# Patient Record
Sex: Female | Born: 1975 | Race: White | Hispanic: Yes | Marital: Married | Smoking: Never smoker
Health system: Southern US, Community
[De-identification: ages and names within clinical notes are randomized; demographics above are authoritative.]

## PROBLEM LIST (undated history)

## (undated) ENCOUNTER — Inpatient Hospital Stay (HOSPITAL_COMMUNITY): Payer: Self-pay

## (undated) DIAGNOSIS — E119 Type 2 diabetes mellitus without complications: Secondary | ICD-10-CM

## (undated) DIAGNOSIS — I10 Essential (primary) hypertension: Secondary | ICD-10-CM

## (undated) HISTORY — PX: PELVIC LAPAROSCOPY: SHX162

## (undated) HISTORY — DX: Type 2 diabetes mellitus without complications: E11.9

---

## 2003-08-05 HISTORY — PX: CHOLECYSTECTOMY: SHX55

## 2007-04-02 ENCOUNTER — Ambulatory Visit (HOSPITAL_COMMUNITY): Admission: RE | Admit: 2007-04-02 | Discharge: 2007-04-02 | Payer: Self-pay | Admitting: Obstetrics and Gynecology

## 2007-05-06 ENCOUNTER — Ambulatory Visit: Payer: Self-pay | Admitting: Obstetrics & Gynecology

## 2007-05-20 ENCOUNTER — Ambulatory Visit: Payer: Self-pay | Admitting: Obstetrics & Gynecology

## 2007-06-03 ENCOUNTER — Ambulatory Visit: Payer: Self-pay | Admitting: Obstetrics & Gynecology

## 2007-06-03 ENCOUNTER — Ambulatory Visit (HOSPITAL_COMMUNITY): Admission: RE | Admit: 2007-06-03 | Discharge: 2007-06-03 | Payer: Self-pay | Admitting: Obstetrics & Gynecology

## 2007-06-17 ENCOUNTER — Ambulatory Visit: Payer: Self-pay | Admitting: Obstetrics & Gynecology

## 2007-06-21 ENCOUNTER — Ambulatory Visit: Payer: Self-pay | Admitting: Obstetrics & Gynecology

## 2007-06-21 ENCOUNTER — Encounter: Admission: RE | Admit: 2007-06-21 | Discharge: 2007-06-21 | Payer: Self-pay | Admitting: Obstetrics & Gynecology

## 2007-06-28 ENCOUNTER — Ambulatory Visit: Payer: Self-pay | Admitting: Obstetrics & Gynecology

## 2007-07-05 ENCOUNTER — Ambulatory Visit: Payer: Self-pay | Admitting: Obstetrics & Gynecology

## 2007-07-08 ENCOUNTER — Ambulatory Visit: Payer: Self-pay | Admitting: Obstetrics and Gynecology

## 2007-07-12 ENCOUNTER — Ambulatory Visit: Payer: Self-pay | Admitting: Obstetrics & Gynecology

## 2007-07-15 ENCOUNTER — Ambulatory Visit: Payer: Self-pay | Admitting: Obstetrics & Gynecology

## 2007-07-19 ENCOUNTER — Ambulatory Visit: Payer: Self-pay | Admitting: Obstetrics & Gynecology

## 2007-07-22 ENCOUNTER — Ambulatory Visit: Payer: Self-pay | Admitting: Obstetrics & Gynecology

## 2007-07-26 ENCOUNTER — Ambulatory Visit: Payer: Self-pay | Admitting: Obstetrics & Gynecology

## 2007-07-28 ENCOUNTER — Inpatient Hospital Stay (HOSPITAL_COMMUNITY): Admission: RE | Admit: 2007-07-28 | Discharge: 2007-07-28 | Payer: Self-pay | Admitting: Obstetrics & Gynecology

## 2007-07-28 ENCOUNTER — Ambulatory Visit: Payer: Self-pay | Admitting: Gynecology

## 2007-08-02 ENCOUNTER — Ambulatory Visit (HOSPITAL_COMMUNITY): Admission: RE | Admit: 2007-08-02 | Discharge: 2007-08-02 | Payer: Self-pay | Admitting: Obstetrics & Gynecology

## 2007-08-02 ENCOUNTER — Ambulatory Visit: Payer: Self-pay | Admitting: Obstetrics & Gynecology

## 2007-08-06 ENCOUNTER — Ambulatory Visit: Payer: Self-pay | Admitting: Obstetrics and Gynecology

## 2007-08-09 ENCOUNTER — Ambulatory Visit: Payer: Self-pay | Admitting: Obstetrics & Gynecology

## 2007-08-10 ENCOUNTER — Ambulatory Visit: Payer: Self-pay | Admitting: Family Medicine

## 2007-08-10 ENCOUNTER — Inpatient Hospital Stay (HOSPITAL_COMMUNITY): Admission: RE | Admit: 2007-08-10 | Discharge: 2007-08-12 | Payer: Self-pay | Admitting: Family Medicine

## 2007-08-19 ENCOUNTER — Inpatient Hospital Stay (HOSPITAL_COMMUNITY): Admission: AD | Admit: 2007-08-19 | Discharge: 2007-08-19 | Payer: Self-pay | Admitting: Obstetrics & Gynecology

## 2010-04-26 ENCOUNTER — Ambulatory Visit (HOSPITAL_COMMUNITY): Admission: RE | Admit: 2010-04-26 | Discharge: 2010-04-26 | Payer: Self-pay | Admitting: Obstetrics

## 2010-04-30 ENCOUNTER — Ambulatory Visit (HOSPITAL_COMMUNITY): Admission: RE | Admit: 2010-04-30 | Discharge: 2010-04-30 | Payer: Self-pay | Admitting: Obstetrics

## 2010-05-02 ENCOUNTER — Ambulatory Visit (HOSPITAL_COMMUNITY): Admission: RE | Admit: 2010-05-02 | Discharge: 2010-05-02 | Payer: Self-pay | Admitting: Obstetrics

## 2010-05-06 ENCOUNTER — Ambulatory Visit (HOSPITAL_COMMUNITY): Admission: RE | Admit: 2010-05-06 | Discharge: 2010-05-06 | Payer: Self-pay | Admitting: Obstetrics

## 2010-05-08 ENCOUNTER — Inpatient Hospital Stay (HOSPITAL_COMMUNITY): Admission: RE | Admit: 2010-05-08 | Discharge: 2010-05-11 | Payer: Self-pay | Admitting: Obstetrics

## 2010-05-08 ENCOUNTER — Encounter (INDEPENDENT_AMBULATORY_CARE_PROVIDER_SITE_OTHER): Payer: Self-pay | Admitting: Obstetrics

## 2010-08-24 ENCOUNTER — Encounter: Payer: Self-pay | Admitting: Obstetrics & Gynecology

## 2010-08-25 ENCOUNTER — Encounter: Payer: Self-pay | Admitting: Obstetrics

## 2010-10-17 LAB — CBC
HCT: 32.9 % — ABNORMAL LOW (ref 36.0–46.0)
HCT: 40.3 % (ref 36.0–46.0)
Hemoglobin: 14.2 g/dL (ref 12.0–15.0)
MCV: 93.2 fL (ref 78.0–100.0)
MCV: 94.1 fL (ref 78.0–100.0)
RBC: 3.5 MIL/uL — ABNORMAL LOW (ref 3.87–5.11)
RBC: 4.32 MIL/uL (ref 3.87–5.11)
WBC: 5.7 10*3/uL (ref 4.0–10.5)
WBC: 6.5 10*3/uL (ref 4.0–10.5)

## 2010-12-17 NOTE — Discharge Summary (Signed)
[name], [name] NO.:  [id number]   MEDICAL RECORD NO.:  [id number]          PATIENT TYPE:  INP   LOCATION:                                [facility]  [facility]   PHYSICIAN:  Tanya S. Shawnie Pons, M.D.   DATE OF BIRTH:  08/16/1975   DATE OF ADMISSION:  08/10/2007  DATE OF DISCHARGE:  08/12/2007                               DISCHARGE SUMMARY   REASON FOR ADMISSION:  Repeat low transverse cesarean section.   HOSPITAL COURSE:  This is a 35 year old G2, P2-0-0-2 at 40 and 5/7 weeks  who was found to have oligohydramnios in clinic and thus was admitted  for repeat low transverse C-section.  She has a history of chronic  hypertension and gestational diabetes, type A1.  She had her prenatal  care at [facility].  She delivered a viable female via  repeat low transverse cesarean section on August 10, 2007.  The female's  weight was 7 pounds 9 ounces and Apgar's were 9 at 1 minute and 9 at 5  minutes.  EBL was 1000 mL.  No procedures were required postpartum and  patient did well.  Mom plans to breast feed and a lactation consultant  has been by to see her.  She does not want a circumcision for her baby  at this point.  She plans to use Micronor for birth control.  Maternal  labs include a blood type O+ and she is Rubella immune.   CONDITION DISCHARGE:  Stable.   INSTRUCTIONS:  Activity, no heavy lifting for 6 weeks.  Routine diet.  She is to follow up in 6 weeks with the [facility]  Department.  She will have her staples removed on postop day #7 by the  Baby Love Nurse at her home.   MEDICATIONS:  1. Motrin 600 mg p.o. every 6 hours p.r.n. pain.  2. Percocet 5/325 one to 2 tabs every 4 to 6 hours p.r.n. pain.  3. Colace 100 mg p.o. b.i.d. p.r.n. constipation.  4. Prenatal vitamins 1 p.o. daily.  5. Micronor 1 p.o. daily.      Erin Boozer, MD      Erin [name]. Shawnie Pons, M.D.  Electronically Signed    SA/MEDQ  D:  08/12/2007  T:  08/12/2007   Job:  [id number]

## 2010-12-17 NOTE — Op Note (Signed)
NAMEJUSTISE, Erin [name] NO.:  [id number]   MEDICAL RECORD NO.:  [id number]           PATIENT TYPE:   LOCATION:                                [facility]   PHYSICIAN:  Tanya S. Shawnie Pons, M.D.   DATE OF BIRTH:  [birth year]   DATE OF PROCEDURE:  08/10/2007  DATE OF DISCHARGE:                               OPERATIVE REPORT   PREOPERATIVE DIAGNOSES:  1. Intrauterine gestation at 38 weeks 5 days.  2. History of previous cesarean section.  3. Uncomplicated prenatal course.   POSTOPERATIVE DIAGNOSES:  1. Intrauterine gestation at 38 weeks 5 days.  2. History of previous cesarean section.  3. Uncomplicated prenatal course.   PROCEDURE:  Repeat low transverse cesarean section.   SURGEON:  Shelbie Proctor. Shawnie Pons, M.D.   ASSISTANT:  Karlton Lemon, MD.   ANESTHESIA:  Epidural.   FINDINGS:  1. Viable infant female weighing 7 pounds 9 ounces with Apgars 9 at one      minute, 9 at five minutes, in cephalic presentation.  2. Clear amniotic fluid.  3. Normal female pelvic anatomy with decidual changes of pregnancy.   ESTIMATED BLOOD LOSS:  1000 mL   DRAINS:  Foley with clear yellow urine.   COMPLICATIONS:  None immediate.   SPECIMENS:  Placenta to labor and delivery; cord blood to the lab.   INDICATIONS FOR PROCEDURE:  This is a 35 year old gravida 2, para 1-0-0-  1, presenting for desired repeat low transverse cesarean section at 38  weeks and 5 days.   DESCRIPTION OF PROCEDURE:  The patient was taken to the operating room  and after obtaining adequate epidural anesthesia was prepped and draped  in the usual sterile manner in the supine position with left lateral  uterine displacement.  After ensuring adequate anesthesia a Pfannenstiel  skin incision was made using the scalpel.  The incision was carried down  through the subcutaneous tissues using the scalpel.  The rectus fascia  was nicked in the midline.  The incision was extended laterally in each  direction using the  Mayo scissors.  The rectus muscles were dissected  free of the fascia using both sharp and blunt dissection.  The rectus  muscles were separated bluntly.  The parietal peritoneum was identified  and entered bluntly under direct visualization.  At this time an Alexis  wound retractor was placed.  A low transverse uterine incision was made  using the scalpel.  The incision was extended laterally and superior  using blunt dissection.  A hand was placed within the uterine cavity and  used to elevate and flex the head of the infant, which was delivered  without difficulty.  The infant was bulb suctioned after delivery of the  head.  The body of the infant was then delivered without difficulty.  The cord was doubly clamped and cut and the infant handed to the nursery  team in attendance.  A specimen was collected for cord blood.  The  placenta was delivered manually and with cord traction, appeared intact.  The endometrial cavity was wiped of any traces of membranes using wet  laparotomy sponges.  The edges of the uterine  incision were grasped with  ring clamps and the uterine incision was closed in one layer of running  locking 0 Vicryl.  One small area of bleeding was controlled using a  single figure-of-eight stitch of 0 Vicryl.  The uterine incision was  inspected and found to have adequate hemostasis.  The rectus fascia was  reapproximated with 1 suture of 0 Vicryl in a running unlocked fashion.  Small areas of bleeding in the subcutaneous tissue were controlled using  Bovie cautery.  The skin was reapproximated with stainless steel skin  staples.  Sponge, needle, and instrument counts were correct x2.  The  patient tolerated the procedure well and went to the post anesthesia  care unit in stable condition.      Karlton Lemon, MD  Electronically Signed     ______________________________  Shelbie Proctor. Shawnie Pons, M.D.    NS/MEDQ  D:  08/11/2007  T:  08/11/2007  Job:  [id number]

## 2010-12-17 NOTE — Op Note (Signed)
NAMEMarland Kitchen  MALAIYA, PACZKOWSKI NO.:  [id number]   MEDICAL RECORD NO.:  [id number]          PATIENT TYPE:  WOC   LOCATION:  WOC                          [facility]   PHYSICIAN:  Tanya S. Shawnie Pons, M.D.   DATE OF BIRTH:  November 08, 1975   DATE OF PROCEDURE:  08/10/2007  DATE OF DISCHARGE:                               OPERATIVE REPORT   PREOPERATIVE DIAGNOSES:  1. Intrauterine pregnancy at 38 weeks and five days.  2. History of previous cesarean section.  3. Uncomplicated prenatal course.   POSTOPERATIVE DIAGNOSES:  1. Intrauterine pregnancy at 38 weeks and five days.  2. History of previous cesarean section.  3. Uncomplicated prenatal course.   PROCEDURE:  (CANCEL THIS DICTATION).      Karlton Lemon, MD      ______________________________  Shelbie Proctor. Shawnie Pons, M.D.    NS/MEDQ  D:  08/11/2007  T:  08/11/2007  Job:  [id number]

## 2011-04-24 LAB — URINALYSIS, ROUTINE W REFLEX MICROSCOPIC
Glucose, UA: NEGATIVE
Specific Gravity, Urine: 1.01
Urobilinogen, UA: 0.2

## 2011-04-24 LAB — ABO/RH: ABO/RH(D): O POS

## 2011-04-24 LAB — POCT URINALYSIS DIP (DEVICE)
Bilirubin Urine: NEGATIVE
Glucose, UA: NEGATIVE
Hgb urine dipstick: NEGATIVE
Ketones, ur: NEGATIVE
Nitrite: NEGATIVE
Specific Gravity, Urine: 1.02

## 2011-04-24 LAB — CBC
HCT: 33.6 — ABNORMAL LOW
HCT: 40.5
HCT: 43.3
Hemoglobin: 14.4
Hemoglobin: 15.4 — ABNORMAL HIGH
MCHC: 35.7
MCV: 92.3
Platelets: 159
RBC: 4.39
RDW: 12.3
WBC: 6.2

## 2011-04-24 LAB — COMPREHENSIVE METABOLIC PANEL
Albumin: 3.3 — ABNORMAL LOW
BUN: 18
Creatinine, Ser: 0.6
Glucose, Bld: 103 — ABNORMAL HIGH
Total Bilirubin: 0.6
Total Protein: 7.3

## 2011-04-24 LAB — URINE MICROSCOPIC-ADD ON

## 2011-04-24 LAB — TYPE AND SCREEN: ABO/RH(D): O POS

## 2011-05-09 LAB — COMPREHENSIVE METABOLIC PANEL
AST: 15
Albumin: 2.7 — ABNORMAL LOW
BUN: 7
Calcium: 8.5
Chloride: 105
Creatinine, Ser: 0.48
GFR calc Af Amer: >60
Total Bilirubin: 0.5

## 2011-05-09 LAB — POCT URINALYSIS DIP (DEVICE)
Bilirubin Urine: NEGATIVE
Glucose, UA: NEGATIVE
Hgb urine dipstick: NEGATIVE
Hgb urine dipstick: NEGATIVE
Ketones, ur: 15 — AB
Ketones, ur: 40 — AB
Nitrite: NEGATIVE
Operator id: 120861
Operator id: 194561
Operator id: 297281
Protein, ur: NEGATIVE
Protein, ur: NEGATIVE
Specific Gravity, Urine: 1.015
Specific Gravity, Urine: 1.015
Specific Gravity, Urine: 1.02
Urobilinogen, UA: 0.2
Urobilinogen, UA: 0.2
Urobilinogen, UA: 0.2
pH: 6
pH: 6

## 2011-05-09 LAB — CBC
HCT: 40
MCHC: 35.7
MCV: 93
Platelets: 197
RDW: 13
WBC: 6.1

## 2011-05-12 LAB — POCT URINALYSIS DIP (DEVICE)
Bilirubin Urine: NEGATIVE
Glucose, UA: NEGATIVE
Glucose, UA: NEGATIVE
Nitrite: NEGATIVE
Nitrite: NEGATIVE
Operator id: 194561
Operator id: 194561
Specific Gravity, Urine: 1.02
Urobilinogen, UA: 1
Urobilinogen, UA: 1

## 2011-05-13 LAB — POCT URINALYSIS DIP (DEVICE)
Bilirubin Urine: NEGATIVE
Glucose, UA: NEGATIVE
Glucose, UA: NEGATIVE
Nitrite: NEGATIVE
Nitrite: NEGATIVE
Operator id: 194561
Operator id: 159681
Urobilinogen, UA: 1
Urobilinogen, UA: 0.2

## 2011-05-14 LAB — POCT URINALYSIS DIP (DEVICE)
Glucose, UA: NEGATIVE
Glucose, UA: NEGATIVE
Hgb urine dipstick: NEGATIVE
Hgb urine dipstick: NEGATIVE
Nitrite: NEGATIVE
Nitrite: NEGATIVE
Operator id: 148111
Urobilinogen, UA: 0.2
Urobilinogen, UA: 0.2

## 2011-05-15 LAB — POCT URINALYSIS DIP (DEVICE)
Glucose, UA: NEGATIVE
Hgb urine dipstick: NEGATIVE
Nitrite: NEGATIVE
Urobilinogen, UA: 0.2
pH: 6

## 2016-01-23 ENCOUNTER — Ambulatory Visit: Payer: Self-pay

## 2016-01-24 ENCOUNTER — Encounter: Payer: Self-pay | Admitting: Pediatric Intensive Care

## 2016-01-24 ENCOUNTER — Ambulatory Visit: Payer: Self-pay | Attending: Internal Medicine

## 2016-01-24 DIAGNOSIS — I10 Essential (primary) hypertension: Secondary | ICD-10-CM

## 2016-01-24 NOTE — Congregational Nurse Program (Signed)
Congregational Nurse Program Note  Date of Encounter: 01/24/2016  Past Medical History: No past medical history on file.  Encounter Details:     CNP Questionnaire - 01/24/16 1528    Patient Demographics   Is this a new or existing patient? New   Patient is considered a/an Immigrant   Race Latino/Hispanic   Patient Assistance   Location of Patient Assistance [employer]   Patient's financial/insurance status [employer] officer, [employer]/Care Connects   Uninsured Patient Yes   Interventions Not Applicable   Patient referred to apply for the following financial assistance [employer] Renewal   Food insecurities addressed Not Applicable   Transportation assistance No   Assistance securing medications No   Educational health offerings Hypertension   Encounter Details   Primary purpose of visit Education/Health Concerns   Was an Emergency Department visit averted? Not Applicable   Does patient have a medical provider? Yes   Patient referred to Not Applicable   Was a mental health screening completed? (GAINS tool) No   Does patient have dental issues? No   Does patient have vision issues? No   Does your patient have an abnormal blood pressure today? No   Since previous encounter, have you referred patient for abnormal blood pressure that resulted in a new diagnosis or medication change? No   Does your patient have an abnormal blood glucose today? No   Since previous encounter, have you referred patient for abnormal blood glucose that resulted in a new diagnosis or medication change? No   Was there a life-saving intervention made? No    Client presented for BP check. States (via interpreter Angie) that she has been taking lisinopril 10 mg daily for four years and is seen yearly by a medical provider for monitoring. States that she did not take her lisinopril yet today but will when she gets home. Encouraged to follow up at [facility] with CN for BP checks. BP was normal today.

## 2016-10-06 ENCOUNTER — Ambulatory Visit: Payer: Self-pay | Admitting: Gynecology

## 2016-10-09 ENCOUNTER — Encounter: Payer: Self-pay | Admitting: Gynecology

## 2016-10-09 ENCOUNTER — Ambulatory Visit (INDEPENDENT_AMBULATORY_CARE_PROVIDER_SITE_OTHER): Payer: Self-pay | Admitting: Gynecology

## 2016-10-09 VITALS — BP 118/72 | Ht 60.0 in | Wt 135.0 lb

## 2016-10-09 DIAGNOSIS — Z30432 Encounter for removal of intrauterine contraceptive device: Secondary | ICD-10-CM

## 2016-10-09 DIAGNOSIS — Z3009 Encounter for other general counseling and advice on contraception: Secondary | ICD-10-CM

## 2016-10-09 NOTE — Progress Notes (Signed)
   Patient is a 41 year old gravida 3 para 3 (3 C-sections) she is using for contraception more than cycle control. Patient had all her blood work and Pap smear recently at the health department has informing that all studies were normal and her flu vaccine is up-to-date.  Exam: Pelvic: Bartholin urethra Skene was within normal limits Vagina: No lesions or discharge Cervix: IUD string was visualized the area was cleansed with Betadine solution and with the use of a ring forcep the IUD string was grasped retrieved shown to the patient and discarded. Bimanual exam not done Rectal exam: Not done  Assessment/plan: As per patient's request Mirena IUD was removed today. We discussed all forms of contraception to include maneuvering the oral contraceptive pill as well as the Nexplanon patient interested in and replacing the Mirena IUD sometime in the summer. Literature information was provided.

## 2016-10-09 NOTE — Patient Instructions (Signed)
Levonorgestrel intrauterine device (IUD) Qu es este medicamento? El DIU CON LEVONORGESTREL es un dispositivo anticonceptivo (control de la natalidad). Un profesional de la salud coloca el dispositivo dentro el tero. Se usa para evitar los embarazos. Este dispositivo tambin puede usarse para tratar el sangrado intenso que se produce durante el perodo menstrual. Este medicamento puede ser utilizado para otros usos; si tiene alguna pregunta consulte con su proveedor de atencin mdica o con su farmacutico. MARCAS COMUNES: Kyleena, LILETTA, Mirena, Skyla Qu le debo informar a mi profesional de la salud antes de tomar este medicamento? Necesitan saber si usted presenta alguno de los siguientes problemas o situaciones: examen de Papanicolaou anormal cncer de mama, tero o crvix diabetes endometritis infeccin genital o plvica actual o en el pasado tener ms de una pareja sexual o si su pareja tiene ms de una pareja enfermedad cardiaca antecedentes de embarazo ectpico o tubrico problemas del sistema inmunolgico DIU colocado en su lugar enfermedad o tumor heptico problemas con cogulos sanguneos o tomar anticoagulantes convulsiones uso de drogas intravenosas tero con forma inusual sangrado vaginal que no ha sido explicado una reaccin alrgica o inusual al levonorgestrel, a otras hormonas, a la silicona, o al polietileno, a otros medicamentos, alimentos, colorantes o conservantes si est embarazada o buscando quedar embarazada si est amamantando a un beb Cmo debo utilizar este medicamento? Un profesional de la salud coloca este dispositivo en el tero. Hable con su pediatra para informarse acerca del uso de este medicamento en nios. Puede requerir atencin especial. Sobredosis: Pngase en contacto inmediatamente con un centro toxicolgico o una sala de urgencia si usted cree que haya tomado demasiado medicamento. ATENCIN: Este medicamento es solo para usted. No comparta este medicamento  con nadie. Qu sucede si me olvido de una dosis? No se aplica en este caso. Segn la marca del dispositivo que tenga insertado, el dispositivo deber reemplazarse cada 3 a 5 aos si desea continuar usando este tipo de mtodo anticonceptivo. Qu puede interactuar con este medicamento? No tome este medicamento con ninguno de los siguientes frmacos: amprenavir bosentano fosamprenavir Este medicamento tambin puede interactuar con los siguientes medicamentos: aprepitant armodafinilo barbitricos para inducir el sueo o para el tratamiento de convulsiones bexaroteno boceprevir griseofulvina medicamentos para tratar convulsiones, tales como carbamazepina, etotona, felbamato, oxcarbazepina, fenitona, topiramato modafinilo pioglitazona rifabutina rifampicina rifapentina algunos medicamentos para tratar la infeccin por el VIH, tales como atazanavir, efavirenz, indinavir, lopinavir, nelfinavir, tipranavir, ritonavir hierba de [place] warfarina Puede ser que esta lista no menciona todas las posibles interacciones. Informe a su profesional de la salud de todos los productos a base de hierbas, medicamentos de venta libre o suplementos nutritivos que est tomando. Si usted fuma, consume bebidas alcohlicas o si utiliza drogas ilegales, indqueselo tambin a su profesional de la salud. Algunas sustancias pueden interactuar con su medicamento. A qu debo estar atento al usar este medicamento? Visite a su mdico o a su profesional de la salud para revisar su evolucin peridicamente. Consulte a su mdico si usted o su pareja tiene contacto sexual con otras personas, descubre que tiene VIH positivo, o contrae una enfermedad de transmisin sexual. Este producto no la protege de la infeccin por VIH (SIDA) ni de ninguna otra enfermedad de transmisin sexual. Puede verificar la colocacin del DIU usted misma colocando los dedos limpios en la parte superior de la vagina para tocar los hilos. No tire de los hilos. Es  un buen hbito verificar la colocacin del DIU despus de cada perodo menstrual. Llame a su mdico   de inmediato si siente ms del DIU que solo los hilos o si no puede sentir los hilos. El DIU podra salirse solo. Es posible que quede embarazada si el dispositivo se sale. Si nota que se ha salido el DIU use un mtodo anticonceptivo de respaldo, como condones, y llame a su proveedor de atencin mdica. Usar tampones no cambiar la posicin del DIU y puede usarlos sin problema durante sus perodos menstruales. Este DIU puede escanearse en forma segura en una resonancia magntica (MRI) solo bajo condiciones especficas. Antes de realizarse una resonancia magntica, informe a su proveedor de atencin mdica que tiene colocado un DIU, y el tipo de DIU que tiene. Qu efectos secundarios puedo tener al utilizar este medicamento? Efectos secundarios que debe informar a su mdico o a su profesional de la salud tan pronto como sea posible: -reacciones alrgicas como erupcin cutnea, picazn o urticarias, hinchazn de la cara, labios o lengua -fiebre, sntomas gripales -llagas genitales -alta presin sangunea -ausencia de un perodo menstrual durante 6 semanas mientras lo utiliza -dolor, hinchazn o calor en las piernas -dolor o sensibilidad del plvico -dolor de cabeza repentino o severo -signos de embarazo -calambres estomacales -falta de aliento repentina -problemas de coordinacin, del habla, al caminar -sangrado, flujo vaginal inusual -color amarillento de los ojos o la piel Efectos secundarios que, por lo general, no requieren atencin mdica (debe informarlos a su mdico o a su profesional de la salud si persisten o si son molestos): -acn -dolor de pecho -cambios en el deseo sexual o capacidad -cambios de peso -calambres, mareos o sensacin de desmayo mientras se introduce el dispositivo -dolor de cabeza -sangrado menstruales irregulares en los primeros 3 a 6 meses de usar -nuseas Puede  ser que esta lista no menciona todos los posibles efectos secundarios. Comunquese a su mdico por asesoramiento mdico sobre los efectos secundarios. Usted puede informar los efectos secundarios a la FDA por telfono al 1-800-FDA-1088. Dnde debo guardar mi medicina? No se aplica en este caso. ATENCIN: Este folleto es un resumen. Puede ser que no cubra toda la posible informacin. Si usted tiene preguntas acerca de esta medicina, consulte con su mdico, su farmacutico o su profesional de la salud.  2018 Elsevier/Gold Standard (2016-08-21 00:00:00)  

## 2016-12-17 ENCOUNTER — Encounter: Payer: Self-pay | Admitting: Gynecology

## 2017-07-14 ENCOUNTER — Emergency Department (HOSPITAL_COMMUNITY)
Admission: EM | Admit: 2017-07-14 | Discharge: 2017-07-14 | Disposition: A | Discharge status: Home / Self Care | Payer: Self-pay | Attending: Emergency Medicine | Admitting: Emergency Medicine

## 2017-07-14 ENCOUNTER — Other Ambulatory Visit: Payer: Self-pay

## 2017-07-14 ENCOUNTER — Encounter (HOSPITAL_COMMUNITY): Payer: Self-pay

## 2017-07-14 DIAGNOSIS — Z79899 Other long term (current) drug therapy: Secondary | ICD-10-CM | POA: Insufficient documentation

## 2017-07-14 DIAGNOSIS — R3915 Urgency of urination: Secondary | ICD-10-CM | POA: Insufficient documentation

## 2017-07-14 DIAGNOSIS — N12 Tubulo-interstitial nephritis, not specified as acute or chronic: Secondary | ICD-10-CM | POA: Insufficient documentation

## 2017-07-14 DIAGNOSIS — I10 Essential (primary) hypertension: Secondary | ICD-10-CM | POA: Insufficient documentation

## 2017-07-14 DIAGNOSIS — R1032 Left lower quadrant pain: Secondary | ICD-10-CM | POA: Insufficient documentation

## 2017-07-14 HISTORY — DX: Essential (primary) hypertension: I10

## 2017-07-14 LAB — CBC
HEMATOCRIT: 39.3 % (ref 36.0–46.0)
HEMOGLOBIN: 13.9 g/dL (ref 12.0–15.0)
MCH: 31 pg (ref 26.0–34.0)
MCHC: 35.4 g/dL (ref 30.0–36.0)
MCV: 87.5 fL (ref 78.0–100.0)
Platelets: 255 10*3/uL (ref 150–400)
RBC: 4.49 MIL/uL (ref 3.87–5.11)
RDW: 12.2 % (ref 11.5–15.5)
WBC: 10.6 10*3/uL — ABNORMAL HIGH (ref 4.0–10.5)

## 2017-07-14 LAB — COMPREHENSIVE METABOLIC PANEL
ALBUMIN: 4.2 g/dL (ref 3.5–5.0)
ALT: 14 U/L (ref 14–54)
ANION GAP: 9 (ref 5–15)
AST: 17 U/L (ref 15–41)
Alkaline Phosphatase: 72 U/L (ref 38–126)
BUN: 11 mg/dL (ref 6–20)
CO2: 24 mmol/L (ref 22–32)
Calcium: 9.2 mg/dL (ref 8.9–10.3)
Chloride: 106 mmol/L (ref 101–111)
Creatinine, Ser: 0.48 mg/dL (ref 0.44–1.00)
GFR calc Af Amer: >60 mL/min (ref >60)
GFR calc non Af Amer: >60 mL/min (ref >60)
GLUCOSE: 115 mg/dL — AB (ref 65–99)
POTASSIUM: 3.5 mmol/L (ref 3.5–5.1)
Sodium: 139 mmol/L (ref 135–145)
TOTAL PROTEIN: 7.6 g/dL (ref 6.5–8.1)
Total Bilirubin: 0.4 mg/dL (ref 0.3–1.2)

## 2017-07-14 LAB — URINALYSIS, ROUTINE W REFLEX MICROSCOPIC
Bilirubin Urine: NEGATIVE
Glucose, UA: NEGATIVE mg/dL
Ketones, ur: NEGATIVE mg/dL
NITRITE: POSITIVE — AB
Protein, ur: 30 mg/dL — AB
Specific Gravity, Urine: 1.013 (ref 1.005–1.030)
pH: 5 (ref 5.0–8.0)

## 2017-07-14 LAB — LIPASE, BLOOD: Lipase: 19 U/L (ref 11–51)

## 2017-07-14 LAB — I-STAT BETA HCG BLOOD, ED (MC, WL, AP ONLY)

## 2017-07-14 MED ORDER — CEFTRIAXONE SODIUM 1 G IJ SOLR
1.0000 g | Freq: Once | INTRAMUSCULAR | Status: AC
  Administered 2017-07-14: 1 g via INTRAMUSCULAR
  Filled 2017-07-14: qty 10

## 2017-07-14 MED ORDER — LIDOCAINE HCL (PF) 1 % IJ SOLN
INTRAMUSCULAR | Status: AC
  Administered 2017-07-14: 2.1 mL
  Filled 2017-07-14: qty 5

## 2017-07-14 MED ORDER — SULFAMETHOXAZOLE-TRIMETHOPRIM 800-160 MG PO TABS: 1.0000 | ORAL_TABLET | Freq: Two times a day (BID) | ORAL | 0 refills | Status: AC

## 2017-07-14 NOTE — Discharge Instructions (Signed)
Please read attached information. If you experience any new or worsening signs or symptoms please return to the emergency room for evaluation. Please follow-up with your primary care provider or specialist as discussed. Please use medication prescribed only as directed and discontinue taking if you have any concerning signs or symptoms.   °

## 2017-07-14 NOTE — ED Triage Notes (Signed)
Pt has been having left side abdominal pain X5 days. She also reports she has burning with urination. She also reports running a fever at night. Pt denies n/v/d.

## 2017-07-14 NOTE — ED Provider Notes (Signed)
[facility]   CSN: [id number] Arrival date & time: 07/14/17  1338     History   Chief Complaint Chief Complaint  Patient presents with  . Abdominal Pain    HPI Erin [name] is a 41 y.o. female.  HPI patient's daughter used as Nurse, learning disabilitytranslator at patient's request  41 year old female presents today with complaints of dysuria.  Patient reports 5 days ago she developed dysuria, urinary urgency, left-sided flank pain.  She notes symptoms have persisted since, no nausea vomiting.  Tolerating p.o.  Patient reports subjective fevers at night, none presently.  No history of the same, no recent antibiotics.    Past Medical History:  Diagnosis Date  . Hypertension     There are no active problems to display for this patient.   Past Surgical History:  Procedure Laterality Date  . CESAREAN SECTION    . PELVIC LAPAROSCOPY      OB History    Gravida Para Term Preterm AB Living   3 3       3    SAB TAB Ectopic Multiple Live Births                   Home Medications    Prior to Admission medications   Medication Sig Start Date End Date Taking? Authorizing Provider  levonorgestrel (MIRENA) 20 MCG/24HR IUD 1 each by Intrauterine route once.    [provider]  sulfamethoxazole-trimethoprim (BACTRIM DS,SEPTRA DS) 800-160 MG tablet Take 1 tablet by mouth 2 (two) times daily for 10 days. 07/14/17 07/24/17  Eyvonne MechanicHedges, Kymberlie Brazeau, PA-C    Family History Family History  Problem Relation Age of Onset  . Diabetes Sister   . Diabetes Sister     Social History Social History   Tobacco Use  . Smoking status: Never Smoker  . Smokeless tobacco: Never Used  Substance Use Topics  . Alcohol use: No  . Drug use: No     Allergies   Patient has no known allergies.   Review of Systems Review of Systems  All other systems reviewed and are negative.    Physical Exam Updated Vital Signs BP 124/74 (BP Location:  Right Arm)   Pulse 81   Temp 99.2 F (37.3 C) (Oral)   Resp 18   Wt 59.9 kg (132 lb)   LMP 05/31/2017 (Within Days)   SpO2 100%   BMI 25.78 kg/m   Physical Exam  Constitutional: She is oriented to person, place, and time. She appears well-developed and well-nourished.  HENT:  Head: Normocephalic and atraumatic.  Eyes: Conjunctivae are normal. Pupils are equal, round, and reactive to light. Right eye exhibits no discharge. Left eye exhibits no discharge. No scleral icterus.  Neck: Normal range of motion. No JVD present. No tracheal deviation present.  Pulmonary/Chest: Effort normal. No stridor.  Abdominal: There is CVA tenderness.  Minor right-sided CVA tenderness, abdomen soft nontender-minor suprapubic tenderness palpation  Neurological: She is alert and oriented to person, place, and time. Coordination normal.  Psychiatric: She has a normal mood and affect. Her behavior is normal. Judgment and thought content normal.  Nursing note and vitals reviewed.   ED Treatments / Results  Labs (all labs ordered are listed, but only abnormal results are displayed) Labs Reviewed  COMPREHENSIVE METABOLIC PANEL - Abnormal; Notable for the following components:      Result Value   Glucose, Bld 115 (*)    All other components within normal limits  CBC - Abnormal;  Notable for the following components:   WBC 10.6 (*)    All other components within normal limits  URINALYSIS, ROUTINE W REFLEX MICROSCOPIC - Abnormal; Notable for the following components:   APPearance CLOUDY (*)    Hgb urine dipstick LARGE (*)    Protein, ur 30 (*)    Nitrite POSITIVE (*)    Leukocytes, UA LARGE (*)    Bacteria, UA RARE (*)    Squamous Epithelial / LPF 0-5 (*)    All other components within normal limits  URINE CULTURE  LIPASE, BLOOD  I-STAT BETA HCG BLOOD, ED ([facility], [facility], [facility] ONLY)    EKG  EKG Interpretation None       Radiology No results found.  Procedures Procedures (including critical care  time)  Medications Ordered in ED Medications  cefTRIAXone (ROCEPHIN) injection 1 g (1 g Intramuscular Given 07/14/17 1826)  lidocaine (PF) (XYLOCAINE) 1 % injection (2.1 mLs  Given 07/14/17 1827)     Initial Impression / Assessment and Plan / ED Course  I have reviewed the triage vital signs and the nursing notes.  Pertinent labs & imaging results that were available during my care of the patient were reviewed by me and considered in my medical decision making (see chart for details).      Final Clinical Impressions(s) / ED Diagnoses   Final diagnoses:  Pyelonephritis   Labs: I-STAT beta-hCG, urinalysis, CBC, BMP  Imaging:  Consults:  Therapeutics: Ceftriaxone  Discharge Meds: Bactrim  Assessment/Plan: 41 year old female presents today with urinary tract infection, with likely pyelonephritis.  Patient very slight discomfort with CVA percussion, abdomen soft nontender.  No fever, urinalysis consistent with UTI.  Patient will be given a dose of ceftriaxone here, followed by Bactrim at home.  She is instructed to return immediately with any new or worsening signs or symptoms, patient verbalized understanding and agreement to today's plan had no further questions or concerns at the time of discharge.    ED Discharge Orders        Ordered    sulfamethoxazole-trimethoprim (BACTRIM DS,SEPTRA DS) 800-160 MG tablet  2 times daily     07/14/17 1921       Rosalio LoudHedges, Shaquana Buel, PA-C 07/14/17 1935    Nira Connardama, Pedro Eduardo, MD 07/15/17 [phone]

## 2017-07-17 LAB — URINE CULTURE: Culture: >=100000 — AB

## 2017-07-18 ENCOUNTER — Telehealth: Payer: Self-pay

## 2017-07-18 NOTE — Telephone Encounter (Signed)
Post ED Visit - Positive Culture Follow-up  Culture report reviewed by antimicrobial stewardship pharmacist:  []  Erin [name], Pharm.D. []  Erin [name], Pharm.D., BCPS AQ-ID []  Erin [name], Pharm.D., BCPS []  Erin [name], Pharm.D., BCPS []  Erin [name], [address] BoulevardPharm.D., BCPS, AAHIVP []  Erin [name], Pharm.D., BCPS, AAHIVP []  Erin [name], PharmD, BCPS []  Erin [name], PharmD, BCPS []  Erin [name], PharmD, BCPS [name] Pharm D Positive urine culture Treated with Sulfamethoxazole-Trimeth, organism sensitive to the same and no further patient follow-up is required at this time.  Jerry CarasCullom, Kizzy Olafson Burnett 07/18/2017, 9:46 AM

## 2018-06-07 ENCOUNTER — Ambulatory Visit (INDEPENDENT_AMBULATORY_CARE_PROVIDER_SITE_OTHER): Payer: Self-pay | Admitting: *Deleted

## 2018-06-07 ENCOUNTER — Encounter: Payer: Self-pay | Admitting: Family Medicine

## 2018-06-07 DIAGNOSIS — Z3200 Encounter for pregnancy test, result unknown: Secondary | ICD-10-CM

## 2018-06-07 DIAGNOSIS — Z3201 Encounter for pregnancy test, result positive: Secondary | ICD-10-CM

## 2018-06-07 DIAGNOSIS — Z3480 Encounter for supervision of other normal pregnancy, unspecified trimester: Secondary | ICD-10-CM

## 2018-06-07 LAB — POCT PREGNANCY, URINE: Preg Test, Ur: POSITIVE — AB

## 2018-06-07 NOTE — Progress Notes (Signed)
Here for UPT which was positive. Also c/o light bleeding this am only- states less now than it was earlier. Advised if it becomes heavier like a period to go to mau . States LMP 03/18/18 and sure of that date. This makes her [redacted]w[redacted]d with EDD 12/23/18. Advised to start prenatal care asap. She is already taking PNV.

## 2018-06-15 ENCOUNTER — Inpatient Hospital Stay (HOSPITAL_COMMUNITY)
Admission: AD | Admit: 2018-06-15 | Discharge: 2018-06-15 | Disposition: A | Discharge status: Home / Self Care | Payer: Self-pay | Source: Ambulatory Visit | Attending: Obstetrics & Gynecology | Admitting: Obstetrics & Gynecology

## 2018-06-15 ENCOUNTER — Ambulatory Visit (HOSPITAL_COMMUNITY)
Admission: RE | Admit: 2018-06-15 | Discharge: 2018-06-15 | Disposition: A | Discharge status: Home / Self Care | Payer: Self-pay | Source: Ambulatory Visit | Attending: Obstetrics & Gynecology | Admitting: Obstetrics & Gynecology

## 2018-06-15 ENCOUNTER — Ambulatory Visit (INDEPENDENT_AMBULATORY_CARE_PROVIDER_SITE_OTHER): Payer: Self-pay | Admitting: Family Medicine

## 2018-06-15 VITALS — BP 163/78 | HR 82 | Wt 136.6 lb

## 2018-06-15 DIAGNOSIS — O009 Unspecified ectopic pregnancy without intrauterine pregnancy: Secondary | ICD-10-CM | POA: Insufficient documentation

## 2018-06-15 DIAGNOSIS — Z3A01 Less than 8 weeks gestation of pregnancy: Secondary | ICD-10-CM | POA: Insufficient documentation

## 2018-06-15 DIAGNOSIS — Z3A12 12 weeks gestation of pregnancy: Secondary | ICD-10-CM | POA: Insufficient documentation

## 2018-06-15 DIAGNOSIS — N939 Abnormal uterine and vaginal bleeding, unspecified: Secondary | ICD-10-CM

## 2018-06-15 DIAGNOSIS — O3680X0 Pregnancy with inconclusive fetal viability, not applicable or unspecified: Secondary | ICD-10-CM

## 2018-06-15 DIAGNOSIS — O008 Other ectopic pregnancy without intrauterine pregnancy: Secondary | ICD-10-CM

## 2018-06-15 DIAGNOSIS — O209 Hemorrhage in early pregnancy, unspecified: Secondary | ICD-10-CM

## 2018-06-15 LAB — CBC WITH DIFFERENTIAL/PLATELET
BASOS ABS: 0 10*3/uL (ref 0.0–0.1)
Basophils Relative: 0 %
Eosinophils Absolute: 0 10*3/uL (ref 0.0–0.5)
Eosinophils Relative: 0 %
HEMATOCRIT: 42.5 % (ref 36.0–46.0)
HEMOGLOBIN: 15 g/dL (ref 12.0–15.0)
LYMPHS ABS: 2.3 10*3/uL (ref 0.7–4.0)
LYMPHS PCT: 26 %
MCH: 31.5 pg (ref 26.0–34.0)
MCHC: 35.3 g/dL (ref 30.0–36.0)
MCV: 89.3 fL (ref 80.0–100.0)
MONOS PCT: 4 %
Monocytes Absolute: 0.4 10*3/uL (ref 0.1–1.0)
Neutro Abs: 6.2 10*3/uL (ref 1.7–7.7)
Neutrophils Relative %: 70 %
Platelets: 250 10*3/uL (ref 150–400)
RBC: 4.76 MIL/uL (ref 3.87–5.11)
RDW: 12.3 % (ref 11.5–15.5)
WBC: 8.9 10*3/uL (ref 4.0–10.5)
nRBC: 0 % (ref 0.0–0.2)

## 2018-06-15 LAB — COMPREHENSIVE METABOLIC PANEL
ALBUMIN: 4.7 g/dL (ref 3.5–5.0)
ALK PHOS: 59 U/L (ref 38–126)
ALT: 19 U/L (ref 0–44)
ANION GAP: 9 (ref 5–15)
AST: 16 U/L (ref 15–41)
BUN: 13 mg/dL (ref 6–20)
CALCIUM: 9 mg/dL (ref 8.9–10.3)
CHLORIDE: 100 mmol/L (ref 98–111)
CO2: 22 mmol/L (ref 22–32)
Creatinine, Ser: 0.45 mg/dL (ref 0.44–1.00)
GFR calc non Af Amer: >60 mL/min (ref >60)
GLUCOSE: 107 mg/dL — AB (ref 70–99)
Potassium: 3.2 mmol/L — ABNORMAL LOW (ref 3.5–5.1)
Sodium: 131 mmol/L — ABNORMAL LOW (ref 135–145)
Total Bilirubin: 0.5 mg/dL (ref 0.3–1.2)
Total Protein: 8.3 g/dL — ABNORMAL HIGH (ref 6.5–8.1)

## 2018-06-15 LAB — HCG, QUANTITATIVE, PREGNANCY: hCG, Beta Chain, Quant, S: 6369 m[IU]/mL — ABNORMAL HIGH (ref <5)

## 2018-06-15 LAB — TYPE AND SCREEN
ABO/RH(D): O POS
Antibody Screen: NEGATIVE

## 2018-06-15 MED ORDER — METHOTREXATE INJECTION FOR WOMEN'S HOSPITAL
50.0000 mg/m2 | Freq: Once | INTRAMUSCULAR | Status: AC
  Administered 2018-06-15: 80 mg via INTRAMUSCULAR
  Filled 2018-06-15: qty 1.6

## 2018-06-15 NOTE — Progress Notes (Addendum)
Interpreter Erin SimplerAlis [name] present for encounter. Visit for New Ob intake. LMP 03/18/18. EDD 12/22/17 - now 12w 5d. Pt states her HTN was diagnosed during her first pregnancy. She has a PCP and states she was told by PCP to take medication (Lisinopril 10 mg)  when her BP was elevated. She checks her BP every 3 days. She stopped taking the medication 3 months ago due to pregnancy. She had diet controlled gestational diabetes with 2nd pregnancy only. Pt reports intermittent vaginal spotting x8 days. She has passed several very small clots and today passed a larger one- about 3cm diameter with bright red bleeding. She denies having pain. FHR could not be obtained with doppler. Informal bedside US performed for FHR and neither gestational sac nor fetal pole were visualized with abdominal transducer. Consult with Dorathy KinsmanVirginia Smith in office - Pt will have stat US for viability and return to office today to receive results. Dr. Debroah LoopArnold informed of pt status.    1705 Results of US called to Dr. Adrian BlackwaterStinson and he reviewed report in Epic remotely. He will come to office to discuss results with pt.

## 2018-06-15 NOTE — Discharge Instructions (Signed)
·   Avoid anti-inflammatories like Advil/ibuprofen and Aleve/naproxen  It is okay to take Tylenol for discomfort  Return to the MAU in 4 days (Friday, November 15) for repeat labs   Refrain from sexual intercourse and strenuous exercise for the next couple of weeks

## 2018-06-15 NOTE — Progress Notes (Signed)
Patient Name: Erin [name], female   DOB: 06/03/1976, 42 y.o.  MRN: [id number]  Patient seen for US results - no pain.  Results given - c/s ectopic pregnancy. Needs MTX. Questions answered.  Levie HeritageJacob J Leandria Thier, DO 06/15/2018 5:36 PM

## 2018-06-15 NOTE — MAU Provider Note (Signed)
History    CSN: [id number] Arrival date and time: 06/15/18 1742 None    *In-person Spanish interpretor used for visit* HPI [name] is a 42yo [phone] at [redacted]w[redacted]d who presented to clinic today because of vaginal bleeding and no audible heart tones. She had an ultrasound done which was consistent with an intrauterine sac bulging into the myometrium at the site of cesarean section scar. CRL was consistent with 7w3, ectopic pregnancy, non-viable. She was counseled on need for methotrexate given risk of rupture and bleeding with an ectopic pregnancy.    OB History    Gravida  4   Para  3   Term  3   Preterm      AB      Living  3     SAB      TAB      Ectopic      Multiple      Live Births              Past Medical History:  Diagnosis Date  . Diabetes mellitus without complication (HCC)    Gestational with 2nd pregnancy  . Hypertension     Past Surgical History:  Procedure Laterality Date  . CESAREAN SECTION  07/2003, 08/2007, 05/2010  . CHOLECYSTECTOMY  2005  . PELVIC LAPAROSCOPY      Family History  Problem Relation Age of Onset  . Diabetes Sister   . Diabetes Sister   . Diabetes Brother     Social History   Tobacco Use  . Smoking status: Never Smoker  . Smokeless tobacco: Never Used  Substance Use Topics  . Alcohol use: No  . Drug use: No    Allergies: No Known Allergies  No medications prior to admission.    Review of Systems Physical Exam   Height 5' 0.5" (1.537 m), weight 62 kg, last menstrual period 03/18/2018.  Physical Exam  Constitutional: She appears well-developed and well-nourished. No distress.  HENT:  Head: Normocephalic and atraumatic.  Respiratory: No respiratory distress.  Psychiatric: She has a normal mood and affect. Her behavior is normal.   MAU Course  Procedures  MDM -- Arrived to MAU for labs and methotrexate.  Methotrexate Treatment Protocol for Ectopic Pregnancy  Pretreatment testing and  instructions - discussed in clniic hCG concentration - in process Transvaginal ultrasound - done and c/w ectopic, no IUP noted  Blood group and Rh(D) typing - in process and prior O+ Complete blood count - wnl  Liver and renal function tests - wnl  Discontinue folic acid supplements - n/a  [x]  Counsel patient to avoid NSAIDs, recommend acetaminophen if an analgesic is needed  [x]  Advise patient to refrain from sexual intercourse and strenuous exercise   Treatment day  Single dose protocol   1  hCG.  Administer Methotrexate 50 mg/m2 body surface area IM  4  hCG  7  hCG  If <15 percent hCG decline from day 4 to 7, give additional dose of methotrexate 50 mg/m2 IM  If ?15 percent hCG decline from day 4 to 7, draw hCG weekly until undetectable  14  hCG  If <15 percent hCG decline from day 7 to 14, give additional dose of methotrexate 50 mg/m2 IM  If ?15 percent hCG decline from day 7 to 14, check hCG weekly until undetectable  21 and 28  If 3 doses have been given and there is a <15 percent hCG decline from day 21 to 28, proceed with laparoscopic surgery  Laparoscopy  If severe abdominal pain or an acute abdomen suggestive of tubal rupture occurs If ultrasonography reveals greater than 300 mL pelvic or other intraperitoneal fluid  The hCG concentration usually declines to less than 15 mIU/mL by 35 days postinjection but may take as long as 109 days. If the hCG does not decline to zero, a new pregnancy should be excluded; if the hCG is rising, a transvaginal ultrasound should be performed. Alternatively, some patients have a slow clearance of serum hCG. If three weekly values are similar, consider an additional dose of MTX (50 mg/m2) not to exceed the recommended maximum of three total doses. This typically accelerates the decline of serum hCG. The risk of gestational trophoblastic disease is low. Folinic acid rescue is not required for women treated with the single-dose protocol, even if multiple  doses are ultimately given.   Prepared with data from:   [facility]. Clinical practice. Ectopic pregnancy. Malva Limes Engl J Med 2009; 361:379  American College of Obstetricians and Gynecologists. ACOG Practice Bulletin No. 94: Medical management of ectopic pregnancy. Obstet Gynecol 2008; 409:8119111:1479.  Assessment and Plan  42yo [phone] with ectopic pregnancy in scar of prior Cesarean section and CRL of 7w3. Seen in clinic and sent to MAU for methotrexate protocol. Labs checked and LFTs, Cr wnl. Counseled on indication for methotrexate, return precautions, and follow-up needed. IM methotrexate given, STAT hCG ordered for 4 days from now. Instructed patient to go to clinic for visit. Patient voiced understanding of plan, all questions answered.   Tamera StandsLaurel S Kimyatta Lecy, DO  06/16/2018, 4:27 PM

## 2018-06-18 ENCOUNTER — Ambulatory Visit (INDEPENDENT_AMBULATORY_CARE_PROVIDER_SITE_OTHER): Payer: Self-pay

## 2018-06-18 DIAGNOSIS — O008 Other ectopic pregnancy without intrauterine pregnancy: Secondary | ICD-10-CM

## 2018-06-18 LAB — HCG, QUANTITATIVE, PREGNANCY: hCG, Beta Chain, Quant, S: 4523 m[IU]/mL — ABNORMAL HIGH (ref <5)

## 2018-06-18 NOTE — Progress Notes (Signed)
Patient seen and assessed by nursing staff.  Agree with documentation and plan.  

## 2018-06-18 NOTE — Progress Notes (Signed)
Pt here today s/p methotrexate tx day 4.  With Spanish Interpreter Erin [name] pt reports mild cramping pain with no bleeding.  Pt advised to wait in the waiting room for at least two hours for f/u from results.  Pt stated understanding with no further questions.   Notified Dr. Shawnie PonsPratt of pt results.  Provider recommendation to come back on day 7 for STAT beta.  Notified pt of provider's recommendation pt verbalized understanding.

## 2018-06-21 ENCOUNTER — Ambulatory Visit (INDEPENDENT_AMBULATORY_CARE_PROVIDER_SITE_OTHER): Payer: Self-pay | Admitting: General Practice

## 2018-06-21 DIAGNOSIS — O00109 Unspecified tubal pregnancy without intrauterine pregnancy: Secondary | ICD-10-CM

## 2018-06-21 LAB — HCG, QUANTITATIVE, PREGNANCY: hCG, Beta Chain, Quant, S: 2656 m[IU]/mL — ABNORMAL HIGH (ref <5)

## 2018-06-21 NOTE — Progress Notes (Signed)
Patient presents to office today for stat bhcg day #7 labs following MTX. Patient reports only seeing blood when wiping and reports minimal "menstrual cramps". Discussed with patient we are monitoring your bhcg levels today & asked she wait in lobby for results/updated plan of care. Patient verbalized understanding & had no questions at this time. Marlen used for [place] interpreter.  Reviewed results with Dr Debroah LoopArnold who finds appropriate decrease in bhcg levels, patient should have follow up bhcg in 1 week.  Informed patient of results & recommended follow up. Patient verbalized understanding and asked about birth control. Told patient we wouldn't start her on birth control until her bhcg levels are normal. Patient verbalized understanding & had no other questions.  Marylynn Pearsonarrie Addysyn Fern RN BSN  06/21/18

## 2018-06-28 ENCOUNTER — Other Ambulatory Visit: Payer: Self-pay

## 2018-06-28 DIAGNOSIS — O00109 Unspecified tubal pregnancy without intrauterine pregnancy: Secondary | ICD-10-CM

## 2018-06-29 LAB — BETA HCG QUANT (REF LAB): HCG QUANT: 542 m[IU]/mL

## 2018-06-29 NOTE — Progress Notes (Signed)
Repeat HCG in one week, level dropped appropriately after MTX

## 2018-06-30 ENCOUNTER — Other Ambulatory Visit: Payer: Self-pay | Admitting: General Practice

## 2018-06-30 ENCOUNTER — Encounter: Payer: Self-pay | Admitting: Obstetrics and Gynecology

## 2018-06-30 DIAGNOSIS — O3680X0 Pregnancy with inconclusive fetal viability, not applicable or unspecified: Secondary | ICD-10-CM

## 2018-07-05 ENCOUNTER — Other Ambulatory Visit: Payer: Self-pay

## 2018-07-05 DIAGNOSIS — O3680X0 Pregnancy with inconclusive fetal viability, not applicable or unspecified: Secondary | ICD-10-CM

## 2018-07-06 ENCOUNTER — Telehealth: Payer: Self-pay

## 2018-07-06 LAB — BETA HCG QUANT (REF LAB): HCG QUANT: 281 m[IU]/mL

## 2018-07-06 NOTE — Telephone Encounter (Addendum)
-----   Message from Adam PhenixJames G Arnold, MD sent at 07/06/2018 11:33 AM EST ----- HCG decreased, repeat in one week  With Spanish Interpreter [facility] informed pt that her levels are decreasing and we need her to come in a week to follow to make sure that they get to zero.  Pt stated that she will be able to come on 07/12/18 @ 1600.  Front office notified to place pt on schedule.

## 2018-07-12 ENCOUNTER — Other Ambulatory Visit: Payer: Self-pay | Admitting: General Practice

## 2018-07-12 ENCOUNTER — Other Ambulatory Visit: Payer: Self-pay

## 2018-07-12 DIAGNOSIS — O039 Complete or unspecified spontaneous abortion without complication: Secondary | ICD-10-CM

## 2018-07-13 LAB — BETA HCG QUANT (REF LAB): hCG Quant: 187 m[IU]/mL

## 2018-07-15 ENCOUNTER — Telehealth: Payer: Self-pay

## 2018-07-15 NOTE — Telephone Encounter (Addendum)
-----   Message from Adam PhenixJames G Arnold, MD sent at 07/15/2018  8:45 AM EST ----- Please repeat test in one week  With Spanish [facility] Mariel Gallego, pt informed that we need her to come in one week for a beta lab.  I verified with pt if she can come in 07/19/18 for beta lab draw.  Pt agreed that she will be able to come in 07/19/18 @ 1615.  Front office notified.

## 2018-07-19 ENCOUNTER — Other Ambulatory Visit: Payer: Self-pay | Admitting: General Practice

## 2018-07-19 ENCOUNTER — Other Ambulatory Visit: Payer: Self-pay

## 2018-07-19 DIAGNOSIS — O039 Complete or unspecified spontaneous abortion without complication: Secondary | ICD-10-CM

## 2018-07-20 ENCOUNTER — Telehealth: Payer: Self-pay | Admitting: General Practice

## 2018-07-20 DIAGNOSIS — O039 Complete or unspecified spontaneous abortion without complication: Secondary | ICD-10-CM

## 2018-07-20 LAB — BETA HCG QUANT (REF LAB): hCG Quant: 124 m[IU]/mL

## 2018-07-20 NOTE — Telephone Encounter (Signed)
Called patient with pacific interpreter [phone] & informed her of results and discussed recommended follow up. Patient verbalized understanding and states she can come 12/30 @ 4pm. Patient had no questions.

## 2018-07-20 NOTE — Progress Notes (Signed)
Repeat HCG in one to two weeks

## 2018-07-20 NOTE — Telephone Encounter (Signed)
-----   Message from Adam PhenixJames G Arnold, MD sent at 07/20/2018  8:58 AM EST ----- Repeat HCG in one to two weeks

## 2018-08-02 ENCOUNTER — Other Ambulatory Visit: Payer: Self-pay

## 2018-08-02 DIAGNOSIS — O039 Complete or unspecified spontaneous abortion without complication: Secondary | ICD-10-CM

## 2018-08-03 LAB — BETA HCG QUANT (REF LAB): hCG Quant: 64 m[IU]/mL

## 2018-08-10 ENCOUNTER — Telehealth: Payer: Self-pay

## 2018-08-10 NOTE — Telephone Encounter (Addendum)
-----   Message from Adam Phenix, MD sent at 08/10/2018 12:59 PM EST ----- Needs f/u for miscarriage  Scheduled appt for 08/24/18 @ 1555.  Notified pt with Spanish Interpreter Nile Riggs.  I explained to the pt that she will then get another lab draw at the visit.  Pt stated understanding.

## 2018-08-24 ENCOUNTER — Encounter: Payer: Self-pay | Admitting: Student

## 2018-08-24 ENCOUNTER — Ambulatory Visit (INDEPENDENT_AMBULATORY_CARE_PROVIDER_SITE_OTHER): Payer: Self-pay | Admitting: Student

## 2018-08-24 VITALS — BP 167/103 | HR 71 | Wt 136.0 lb

## 2018-08-24 DIAGNOSIS — O008 Other ectopic pregnancy without intrauterine pregnancy: Secondary | ICD-10-CM

## 2018-08-24 DIAGNOSIS — O039 Complete or unspecified spontaneous abortion without complication: Secondary | ICD-10-CM

## 2018-08-24 DIAGNOSIS — O009 Unspecified ectopic pregnancy without intrauterine pregnancy: Secondary | ICD-10-CM | POA: Insufficient documentation

## 2018-08-24 NOTE — Progress Notes (Signed)
History:  Ms. Adaysia Faidley is a 43 y.o. [phone] who presents to clinic today for follow-up for c/s scar ectopic pregnancy. She is s/p methotrexate treatment in November but has had a slow drop in [place].  She had a lot of bleeding on Day 7 of her methotrexate treatment but she has not had any bleeding since, nor her period. She denies pain. She is sexually active but with a condom. She has hypertension and did not take her meds today.   The following portions of the patient's history were reviewed and updated as appropriate: allergies, current medications, family history, past medical history, social history, past surgical history and problem list.  Review of Systems:  Review of Systems  Constitutional: Negative.   HENT: Negative.   Respiratory: Negative.   Cardiovascular: Negative.   Gastrointestinal: Negative.   Genitourinary: Negative.   Musculoskeletal: Negative.   Skin: Negative.       Objective:  Physical Exam BP (!) 167/103   Pulse 71   Wt 136 lb (61.7 kg)   LMP 03/18/2018 (Exact Date)   BMI 26.12 kg/m  Physical Exam Vitals signs reviewed.  Pulmonary:     Effort: Pulmonary effort is normal.  Skin:    General: Skin is warm.  Neurological:     General: No focal deficit present.      Labs and Imaging No results found for this or any previous visit (from the past 24 hour(s)).  No results found.   Assessment & Plan:   1. SAB (spontaneous abortion)   2. Other ectopic pregnancy without intrauterine pregnancy    -Patient to take her blood pressure medicine today when she gets home.  -Discussed birth control; she wants information on how to get a BTL now that she has insurance. Will discuss with billing and [place].  - Beta hCG quant (ref lab). Anticipate drop to less than 1.    Marylene Land, CNM 08/24/2018 4:16 PM

## 2018-08-25 LAB — BETA HCG QUANT (REF LAB): hCG Quant: 30 m[IU]/mL

## 2018-09-06 ENCOUNTER — Other Ambulatory Visit: Payer: Self-pay | Admitting: *Deleted

## 2018-09-06 DIAGNOSIS — O039 Complete or unspecified spontaneous abortion without complication: Secondary | ICD-10-CM

## 2018-09-06 NOTE — Progress Notes (Signed)
bhcg

## 2018-09-07 ENCOUNTER — Other Ambulatory Visit: Payer: Self-pay

## 2018-09-07 DIAGNOSIS — O039 Complete or unspecified spontaneous abortion without complication: Secondary | ICD-10-CM

## 2018-09-08 LAB — BETA HCG QUANT (REF LAB): hCG Quant: 15 m[IU]/mL

## 2018-10-02 ENCOUNTER — Emergency Department (HOSPITAL_COMMUNITY): Payer: Self-pay

## 2018-10-02 ENCOUNTER — Other Ambulatory Visit: Payer: Self-pay

## 2018-10-02 ENCOUNTER — Encounter (HOSPITAL_COMMUNITY): Payer: Self-pay | Admitting: *Deleted

## 2018-10-02 ENCOUNTER — Emergency Department (HOSPITAL_COMMUNITY)
Admission: EM | Admit: 2018-10-02 | Discharge: 2018-10-03 | Disposition: A | Discharge status: Home / Self Care | Payer: Self-pay | Attending: Emergency Medicine | Admitting: Emergency Medicine

## 2018-10-02 DIAGNOSIS — Z79899 Other long term (current) drug therapy: Secondary | ICD-10-CM | POA: Insufficient documentation

## 2018-10-02 DIAGNOSIS — N83201 Unspecified ovarian cyst, right side: Secondary | ICD-10-CM

## 2018-10-02 DIAGNOSIS — N83202 Unspecified ovarian cyst, left side: Secondary | ICD-10-CM | POA: Insufficient documentation

## 2018-10-02 DIAGNOSIS — R1031 Right lower quadrant pain: Secondary | ICD-10-CM

## 2018-10-02 DIAGNOSIS — I10 Essential (primary) hypertension: Secondary | ICD-10-CM | POA: Insufficient documentation

## 2018-10-02 DIAGNOSIS — E119 Type 2 diabetes mellitus without complications: Secondary | ICD-10-CM | POA: Insufficient documentation

## 2018-10-02 LAB — CBC WITH DIFFERENTIAL/PLATELET
Abs Immature Granulocytes: 0.03 10*3/uL (ref 0.00–0.07)
BASOS ABS: 0 10*3/uL (ref 0.0–0.1)
Basophils Relative: 0 %
Eosinophils Absolute: 0.1 10*3/uL (ref 0.0–0.5)
Eosinophils Relative: 1 %
HEMATOCRIT: 38.3 % (ref 36.0–46.0)
HEMOGLOBIN: 13.8 g/dL (ref 12.0–15.0)
IMMATURE GRANULOCYTES: 0 %
LYMPHS ABS: 2.1 10*3/uL (ref 0.7–4.0)
LYMPHS PCT: 26 %
MCH: 31.4 pg (ref 26.0–34.0)
MCHC: 36 g/dL (ref 30.0–36.0)
MCV: 87 fL (ref 80.0–100.0)
Monocytes Absolute: 0.6 10*3/uL (ref 0.1–1.0)
Monocytes Relative: 7 %
NEUTROS ABS: 5.3 10*3/uL (ref 1.7–7.7)
NEUTROS PCT: 66 %
NRBC: 0 % (ref 0.0–0.2)
Platelets: 262 10*3/uL (ref 150–400)
RBC: 4.4 MIL/uL (ref 3.87–5.11)
RDW: 11.8 % (ref 11.5–15.5)
WBC: 8.1 10*3/uL (ref 4.0–10.5)

## 2018-10-02 LAB — URINALYSIS, ROUTINE W REFLEX MICROSCOPIC
BILIRUBIN URINE: NEGATIVE
Glucose, UA: NEGATIVE mg/dL
Hgb urine dipstick: NEGATIVE
KETONES UR: NEGATIVE mg/dL
Nitrite: NEGATIVE
Protein, ur: NEGATIVE mg/dL
Specific Gravity, Urine: 1.023 (ref 1.005–1.030)
pH: 8 (ref 5.0–8.0)

## 2018-10-02 LAB — COMPREHENSIVE METABOLIC PANEL
ALBUMIN: 4 g/dL (ref 3.5–5.0)
ALK PHOS: 66 U/L (ref 38–126)
ALT: 27 U/L (ref 0–44)
ANION GAP: 6 (ref 5–15)
AST: 19 U/L (ref 15–41)
BILIRUBIN TOTAL: 0.6 mg/dL (ref 0.3–1.2)
BUN: 14 mg/dL (ref 6–20)
CALCIUM: 9 mg/dL (ref 8.9–10.3)
CO2: 24 mmol/L (ref 22–32)
Chloride: 107 mmol/L (ref 98–111)
Creatinine, Ser: 0.74 mg/dL (ref 0.44–1.00)
GFR calc Af Amer: >60 mL/min (ref >60)
GFR calc non Af Amer: >60 mL/min (ref >60)
GLUCOSE: 119 mg/dL — AB (ref 70–99)
POTASSIUM: 3 mmol/L — AB (ref 3.5–5.1)
Sodium: 137 mmol/L (ref 135–145)
TOTAL PROTEIN: 7.1 g/dL (ref 6.5–8.1)

## 2018-10-02 LAB — I-STAT BETA HCG BLOOD, ED (MC, WL, AP ONLY): HCG, QUANTITATIVE: 7.1 m[IU]/mL — AB (ref <5)

## 2018-10-02 LAB — PREGNANCY, URINE: PREG TEST UR: NEGATIVE

## 2018-10-02 MED ORDER — IOHEXOL 300 MG/ML  SOLN
100.0000 mL | Freq: Once | INTRAMUSCULAR | Status: AC | PRN
  Administered 2018-10-02: 100 mL via INTRAVENOUS

## 2018-10-02 MED ORDER — FENTANYL CITRATE (PF) 100 MCG/2ML IJ SOLN
50.0000 ug | Freq: Once | INTRAMUSCULAR | Status: AC
  Administered 2018-10-02: 50 ug via INTRAVENOUS
  Filled 2018-10-02: qty 2

## 2018-10-02 NOTE — ED Provider Notes (Signed)
[facility] EMERGENCY DEPARTMENT Provider Note   CSN: [id number] Arrival date & time: 10/02/18  1958    History   Chief Complaint Chief Complaint  Patient presents with  . Abdominal Pain    HPI Erin [name] is a 43 y.o. female.     Patient is a 42 year old female who presents with abdominal pain.  She has a 3-day history of worsening pain in her right lower abdomen.  She states it started in her umbilicus and now is in her right lower quadrant.  No nausea or vomiting.  No change in bowel habits.  No urinary symptoms.  No known fevers.  She is status post an ectopic pregnancy in November 2018 status post methotrexate treatment.  She has had prior cesarean sections but no other abdominal surgery.  She denies any vaginal bleeding.  No vaginal discharge.     Past Medical History:  Diagnosis Date  . Diabetes mellitus without complication (HCC)    Gestational with 2nd pregnancy  . Hypertension     Patient Active Problem List   Diagnosis Date Noted  . Ectopic pregnancy 08/24/2018    Past Surgical History:  Procedure Laterality Date  . CESAREAN SECTION  07/2003, 08/2007, 05/2010  . CHOLECYSTECTOMY  2005  . PELVIC LAPAROSCOPY       OB History    Gravida  4   Para  3   Term  3   Preterm      AB      Living  3     SAB      TAB      Ectopic      Multiple      Live Births               Home Medications    Prior to Admission medications   Medication Sig Start Date End Date Taking? Authorizing Provider  lisinopril (PRINIVIL,ZESTRIL) 10 MG tablet Take 10 mg by mouth daily.    [provider]  Prenatal Multivit-Min-Fe-FA (PRENATAL VITAMINS PO) Take 1 tablet by mouth daily.    [provider]    Family History Family History  Problem Relation Age of Onset  . Diabetes Sister   . Diabetes Sister   . Diabetes Brother     Social History Social History   Tobacco Use  . Smoking status: Never Smoker  .  Smokeless tobacco: Never Used  Substance Use Topics  . Alcohol use: No  . Drug use: No     Allergies   Patient has no known allergies.   Review of Systems Review of Systems  Constitutional: Negative for chills, diaphoresis, fatigue and fever.  HENT: Negative for congestion, rhinorrhea and sneezing.   Eyes: Negative.   Respiratory: Negative for cough, chest tightness and shortness of breath.   Cardiovascular: Negative for chest pain and leg swelling.  Gastrointestinal: Positive for abdominal pain. Negative for blood in stool, diarrhea, nausea and vomiting.  Genitourinary: Negative for difficulty urinating, flank pain, frequency and hematuria.  Musculoskeletal: Negative for arthralgias and back pain.  Skin: Negative for rash.  Neurological: Negative for dizziness, speech difficulty, weakness, numbness and headaches.     Physical Exam Updated Vital Signs BP (!) 106/54   Pulse 72   Temp 98.7 F (37.1 C)   Resp 18   Ht 5' (1.524 m)   Wt 61.2 kg   LMP 07/18/2018   SpO2 98%   Breastfeeding Unknown   BMI 26.37 kg/m   Physical Exam Constitutional:  Appearance: She is well-developed.  HENT:     Head: Normocephalic and atraumatic.  Eyes:     Pupils: Pupils are equal, round, and reactive to light.  Neck:     Musculoskeletal: Normal range of motion and neck supple.  Cardiovascular:     Rate and Rhythm: Normal rate and regular rhythm.     Heart sounds: Normal heart sounds.  Pulmonary:     Effort: Pulmonary effort is normal. No respiratory distress.     Breath sounds: Normal breath sounds. No wheezing or rales.  Chest:     Chest wall: No tenderness.  Abdominal:     General: Bowel sounds are normal.     Palpations: Abdomen is soft.     Tenderness: There is abdominal tenderness in the right lower quadrant. There is no guarding or rebound.  Musculoskeletal: Normal range of motion.  Lymphadenopathy:     Cervical: No cervical adenopathy.  Skin:    General: Skin is  warm and dry.     Findings: No rash.  Neurological:     Mental Status: She is alert and oriented to person, place, and time.      ED Treatments / Results  Labs (all labs ordered are listed, but only abnormal results are displayed) Labs Reviewed  COMPREHENSIVE METABOLIC PANEL - Abnormal; Notable for the following components:      Result Value   Potassium 3.0 (*)    Glucose, Bld 119 (*)    All other components within normal limits  URINALYSIS, ROUTINE W REFLEX MICROSCOPIC - Abnormal; Notable for the following components:   APPearance HAZY (*)    Leukocytes,Ua TRACE (*)    Bacteria, UA RARE (*)    All other components within normal limits  I-STAT BETA HCG BLOOD, ED ([facility], [facility], [facility] ONLY) - Abnormal; Notable for the following components:   I-stat hCG, quantitative 7.1 (*)    All other components within normal limits  CBC WITH DIFFERENTIAL/PLATELET  PREGNANCY, URINE    EKG None  Radiology Ct Abdomen Pelvis W Contrast  Result Date: 10/02/2018 CLINICAL DATA:  43 year old female with right lower quadrant abdominal pain. History of ectopic pregnancy. LMP 3 months ago. EXAM: CT ABDOMEN AND PELVIS WITH CONTRAST TECHNIQUE: Multidetector CT imaging of the abdomen and pelvis was performed using the standard protocol following bolus administration of intravenous contrast. CONTRAST:  100mL OMNIPAQUE IOHEXOL 300 MG/ML  SOLN COMPARISON:  Pregnancy ultrasound dated 06/15/2018 FINDINGS: Lower chest: The visualized lung bases are clear. No intra-abdominal free air or free fluid. Hepatobiliary: The liver is unremarkable. No intrahepatic biliary ductal dilatation. Cholecystectomy. Pancreas: Unremarkable. No pancreatic ductal dilatation or surrounding inflammatory changes. Spleen: Normal in size without focal abnormality. Adrenals/Urinary Tract: The adrenal glands are unremarkable. There is no hydronephrosis on either side. There is symmetric enhancement and excretion of contrast by both kidneys. The  visualized ureters appear unremarkable. Faint linear high attenuating content within the urinary bladder which may represent early excretion of contrast or debris. Correlation with urinalysis recommended to exclude cystitis. Stomach/Bowel: There is moderate stool throughout the colon. No bowel obstruction or active inflammation. The appendix is normal. Vascular/Lymphatic: The abdominal aorta and IVC are unremarkable on this noncontrast CT. No portal venous gas. There is no adenopathy. Reproductive: The uterus is anteverted and appears heterogeneous. A 2 x 3 cm hypodense area with peripheral enhancement along the anterior lower uterine noted which is not well evaluated. Although this may represent a fibroid is appears to somewhat communicate with the endometrium and may represent  an area of adenomyosis. Correlation with the urinalysis recommended to exclude an ectopic pregnancy implanted in the C-section scar and further evaluation with ultrasound recommended. Bilateral ovarian follicles/cysts measure up to 5 cm on the left. 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 No acute osseous pathology. IMPRESSION: 1. No bowel obstruction or active inflammation.  Normal appendix. 2. Heterogeneous appearance of the uterus with focal area of hypodensity in the anterior lower uterus along the C-section. Further evaluation with pelvic ultrasound recommended. 3. Excreted contrast within the urinary bladder versus less likely debris. Correlation with urinalysis recommended to exclude cystitis. Electronically Signed   By: Elgie Collard M.D.   On: 10/02/2018 23:28    Procedures Procedures (including critical care time)  Medications Ordered in ED Medications  fentaNYL (SUBLIMAZE) injection 50 mcg (50 mcg Intravenous Given 10/02/18 2056)  iohexol (OMNIPAQUE) 300 MG/ML solution 100 mL (100 mLs Intravenous Contrast Given 10/02/18 2244)     Initial  Impression / Assessment and Plan / ED Course  I have reviewed the triage vital signs and the nursing notes.  Pertinent labs & imaging results that were available during my care of the patient were reviewed by me and considered in my medical decision making (see chart for details).        Patient presents with pain in her right lower quadrant.  CT scan shows a possible fluid collection adjacent to her C-section scar.  Recommend pelvic ultrasound.  Her labs are non-concerning.  Her urinalysis does not appear to be consistent with infection.  Her pregnancy test is negative.  She is awaiting a pelvic ultrasound.  Dr. Nicanor Alcon to follow.  Final Clinical Impressions(s) / ED Diagnoses   Final diagnoses:  Right lower quadrant abdominal pain    ED Discharge Orders    None       Rolan Bucco, MD 10/02/18 2353

## 2018-10-02 NOTE — ED Triage Notes (Signed)
The pt has had lower abd pain for 2-3 days no n or v   No diarrhea  No fever lmp  lmp 3 months  When she had an ectopic pregnancy  No period since then   rlq pain now in her mid-abdomen

## 2018-10-03 NOTE — ED Notes (Signed)
The pt is not having pain at present

## 2020-02-29 ENCOUNTER — Ambulatory Visit: Payer: Self-pay | Admitting: Nurse Practitioner

## 2020-03-12 ENCOUNTER — Other Ambulatory Visit: Payer: Self-pay | Admitting: Obstetrics and Gynecology

## 2020-03-12 DIAGNOSIS — Z1231 Encounter for screening mammogram for malignant neoplasm of breast: Secondary | ICD-10-CM

## 2020-03-16 ENCOUNTER — Ambulatory Visit (INDEPENDENT_AMBULATORY_CARE_PROVIDER_SITE_OTHER): Payer: Self-pay

## 2020-03-16 DIAGNOSIS — Z23 Encounter for immunization: Secondary | ICD-10-CM

## 2020-03-16 NOTE — Progress Notes (Signed)
° °  Covid-19 Vaccination Clinic  Name:  Jennfer Gassen    MRN: [id number] DOB: 13-Jun-1976  03/16/2020  Ms. Garcia-Martinez was observed post Covid-19 immunization for 15 minutes without incident. She was provided with Vaccine Information Sheet and instruction to access the V-Safe system.   Ms. Dewaine Conger was instructed to call 911 with any severe reactions post vaccine:  Difficulty breathing   Swelling of face and throat   A fast heartbeat   A bad rash all over body   Dizziness and weakness   Immunizations Administered    Name Date Dose VIS Date Route   Pfizer COVID-19 Vaccine 03/16/2020 12:15 PM 0.3 mL 09/28/2018 Intramuscular   Manufacturer: [employer], [employer]   Lot: [id number]   NDC: [address]-2

## 2020-04-10 ENCOUNTER — Other Ambulatory Visit: Payer: Self-pay

## 2020-04-10 ENCOUNTER — Ambulatory Visit (INDEPENDENT_AMBULATORY_CARE_PROVIDER_SITE_OTHER): Payer: Self-pay

## 2020-04-10 ENCOUNTER — Ambulatory Visit: Payer: Self-pay

## 2020-04-10 DIAGNOSIS — Z23 Encounter for immunization: Secondary | ICD-10-CM

## 2020-04-10 NOTE — Progress Notes (Signed)
   Covid-19 Vaccination Clinic  Name:  Erin [name]    MRN: [id number] DOB: [birth year]  04/10/2020  Erin [name] was observed post Covid-19 immunization for 15 minutes without incident. She was provided with Vaccine Information Sheet and instruction to access the V-Safe system.   Erin [name] was instructed to call 911 with any severe reactions post vaccine: Marland Kitchen Difficulty breathing  . Swelling of face and throat  . A fast heartbeat  . A bad rash all over body  . Dizziness and weakness   Immunizations Administered    Name Date Dose VIS Date Route   Pfizer COVID-19 Vaccine 04/10/2020  4:43 PM 0.3 mL 09/28/2018 Intramuscular   Manufacturer: [employer], [employer]   Lot: [id number]   NDC: [address]-3

## 2020-04-19 ENCOUNTER — Ambulatory Visit: Admission: RE | Admit: 2020-04-19 | Payer: Self-pay | Source: Ambulatory Visit

## 2020-04-19 ENCOUNTER — Ambulatory Visit: Payer: No Typology Code available for payment source | Admitting: *Deleted

## 2020-04-19 ENCOUNTER — Other Ambulatory Visit: Payer: Self-pay

## 2020-04-19 VITALS — BP 138/88 | Temp 97.3°F | Wt 139.4 lb

## 2020-04-19 DIAGNOSIS — Z1239 Encounter for other screening for malignant neoplasm of breast: Secondary | ICD-10-CM

## 2020-04-19 NOTE — Patient Instructions (Addendum)
Explained breast self awareness with Erin [name]. Patient did not need a Pap smear today due to last Pap smear and HPV typing was 03/07/2020. Let her know [facility] will cover Pap smears and HPV typing every 5 years unless has a history of abnormal Pap smears. [facility]erred [facility] to the [facility] for a screening mammogram on the mobile unit. Appointment scheduled Thursday, May 31, 2020 at 0830. Patient aware of appointment and will be there. Let patient know the Breast Center will follow up with her within a couple weeks following mammogram with results by letter or phone. Erin [name] verbalized understanding.  Tre Sanker, Kathaleen Maser, RN 8:52 AM

## 2020-04-19 NOTE — Progress Notes (Signed)
Ms. Mckay Brandt is a 44 y.o. female who presents to [facility] clinic today with no complaints.    Pap Smear: Pap smear not completed today. Last Pap smear was 03/07/2020 at the [facility] Department clinic and was normal with negative HPV. Per patient has no history of an abnormal Pap smear. Last Pap smear result is not available in Epic. Pap smear result will be scanned into Epic.   Physical exam: Breasts Breasts symmetrical. No skin abnormalities bilateral breasts. No nipple retraction bilateral breasts. No nipple discharge bilateral breasts. No lymphadenopathy. No lumps palpated bilateral breasts. No complaints of pain or tenderness on exam.       Pelvic/Bimanual Pap is not indicated today per BCCCP guidelines.   Smoking History: Patient has never smoked.   Patient Navigation: Patient education provided. Access to services provided for patient through [place] program. Spanish interpreter Natale Lay from [facility] provided.    Breast and Cervical Cancer Risk Assessment: Patient does not have family history of breast cancer, known genetic mutations, or radiation treatment to the chest before age 89. Patient does not have history of cervical dysplasia, immunocompromised, or DES exposure in-utero.  Risk Assessment    Risk Scores      04/19/2020   Last edited by: Narda Rutherford, LPN   5-year risk: 0.4 %   Lifetime risk: 7.1 %          A: BCCCP exam without pap smear No complaints.  P: Referred patient to the [facility] of [facility] for a screening mammogram on the mobile unit. Appointment scheduled Thursday, May 31, 2020 at 0830.  Priscille Heidelberg, RN 04/19/2020 8:52 AM

## 2020-05-31 ENCOUNTER — Ambulatory Visit
Admission: RE | Admit: 2020-05-31 | Discharge: 2020-05-31 | Disposition: A | Discharge status: Home / Self Care | Payer: No Typology Code available for payment source | Source: Ambulatory Visit | Attending: Obstetrics and Gynecology | Admitting: Obstetrics and Gynecology

## 2020-05-31 ENCOUNTER — Other Ambulatory Visit: Payer: Self-pay

## 2020-05-31 DIAGNOSIS — Z1231 Encounter for screening mammogram for malignant neoplasm of breast: Secondary | ICD-10-CM

## 2020-06-12 ENCOUNTER — Other Ambulatory Visit: Payer: Self-pay | Admitting: Obstetrics and Gynecology

## 2020-06-12 DIAGNOSIS — R928 Other abnormal and inconclusive findings on diagnostic imaging of breast: Secondary | ICD-10-CM

## 2020-06-20 ENCOUNTER — Other Ambulatory Visit: Payer: No Typology Code available for payment source

## 2020-07-03 ENCOUNTER — Other Ambulatory Visit: Payer: Self-pay | Admitting: Obstetrics and Gynecology

## 2020-07-03 ENCOUNTER — Ambulatory Visit
Admission: RE | Admit: 2020-07-03 | Discharge: 2020-07-03 | Disposition: A | Discharge status: Home / Self Care | Payer: No Typology Code available for payment source | Source: Ambulatory Visit | Attending: Obstetrics and Gynecology | Admitting: Obstetrics and Gynecology

## 2020-07-03 ENCOUNTER — Other Ambulatory Visit: Payer: Self-pay

## 2020-07-03 DIAGNOSIS — R928 Other abnormal and inconclusive findings on diagnostic imaging of breast: Secondary | ICD-10-CM

## 2020-10-03 ENCOUNTER — Other Ambulatory Visit: Payer: No Typology Code available for payment source

## 2020-11-04 IMAGING — US US OB TRANSVAGINAL
1 series · 15 of 28 positions shown · non-contrast
Comparison: None.

CLINICAL DATA: Vaginal bleeding for 1 week. No audible Doppler
heart tones. Previous cesarean section.

EXAM:
OBSTETRIC <14 WK US AND TRANSVAGINAL OB US
TECHNIQUE: Both transabdominal and transvaginal ultrasound examinations were
performed for complete evaluation of the gestation as well as the
maternal uterus, adnexal regions, and pelvic cul-de-sac.
Transvaginal technique was performed to assess early pregnancy.

[Series 1: us ob transvaginal · 15 of 95 slices shown]
[im 1/95]
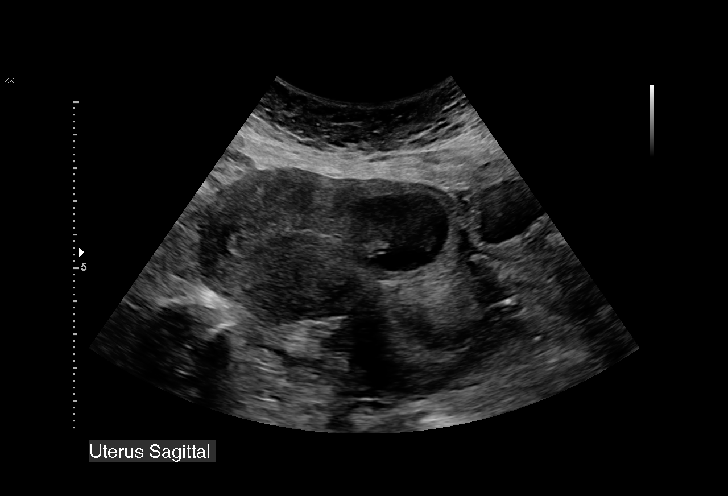
[im 7/95]
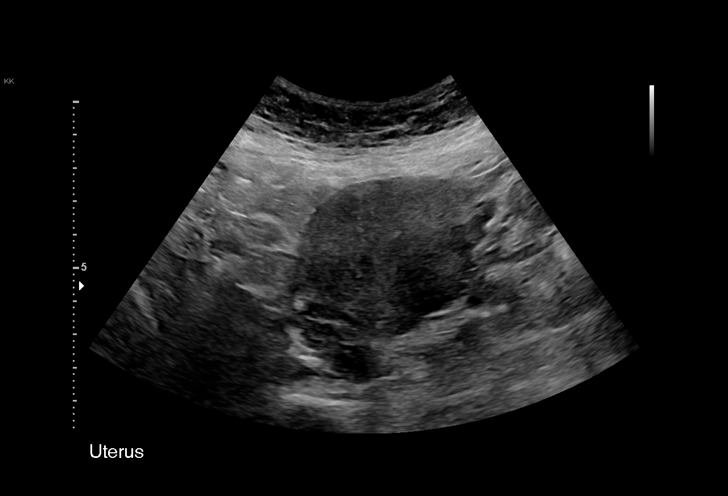
[im 14/95]
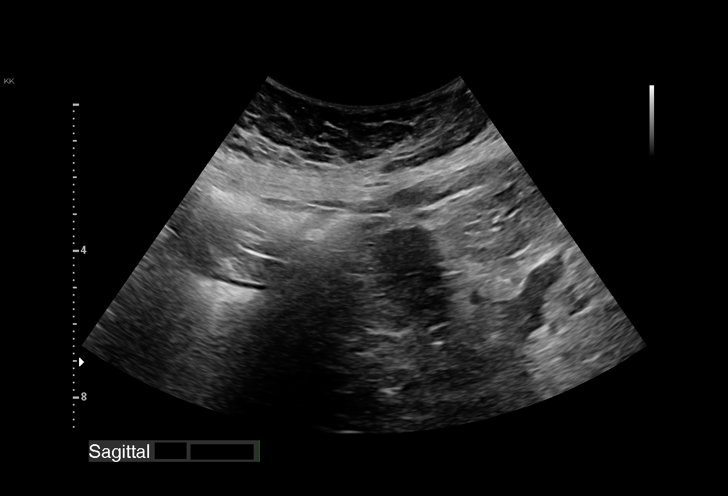
[im 21/95]
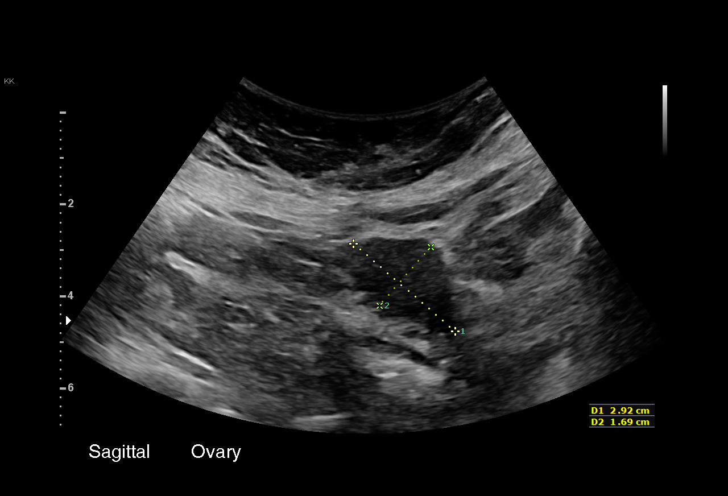
[im 28/95]
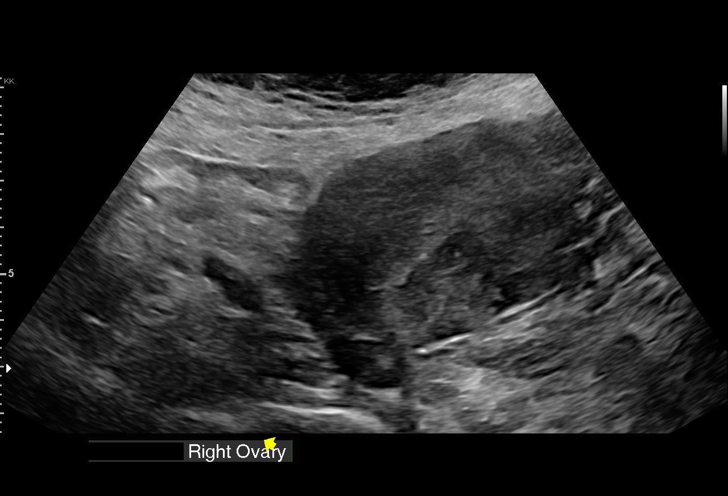
[im 35/95]
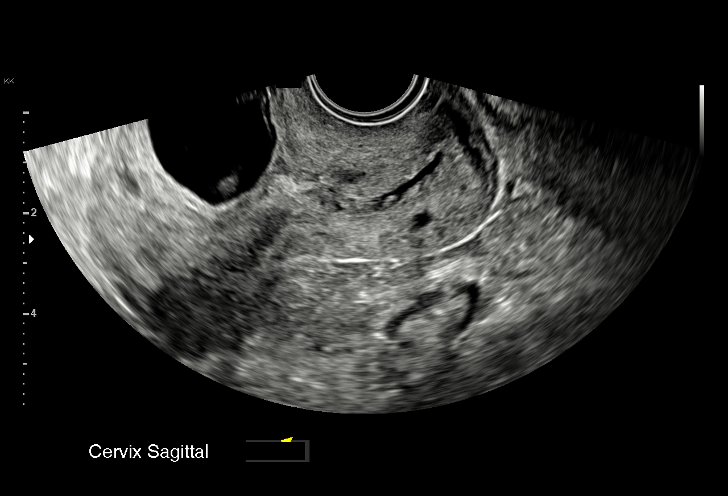
[im 42/95]
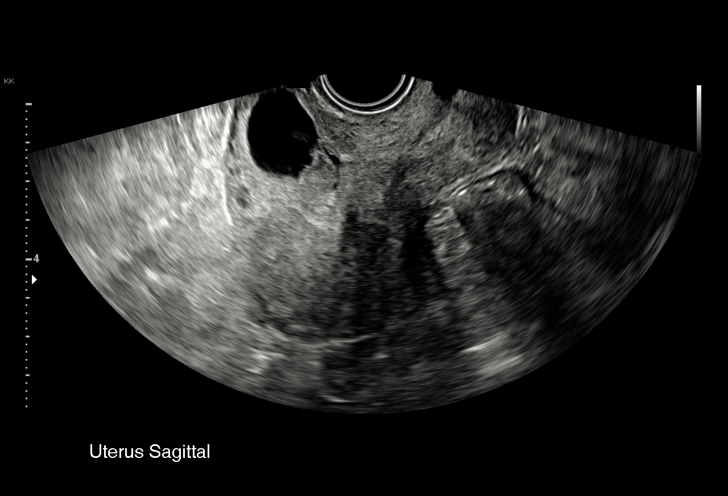
[im 49/95]
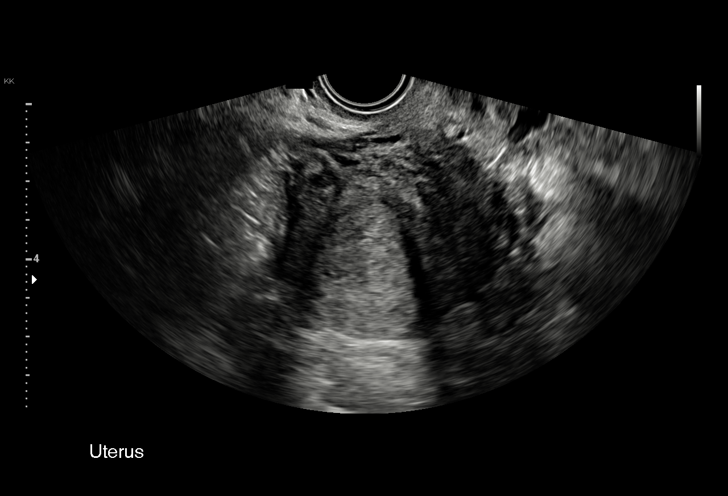
[im 53/95]
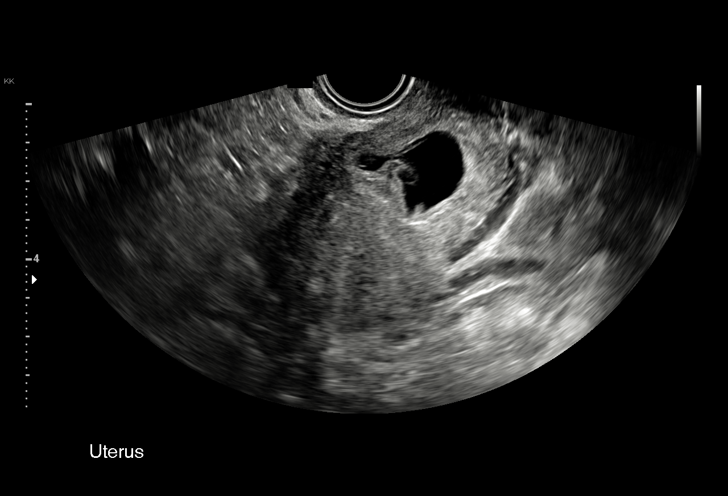
[im 60/95]
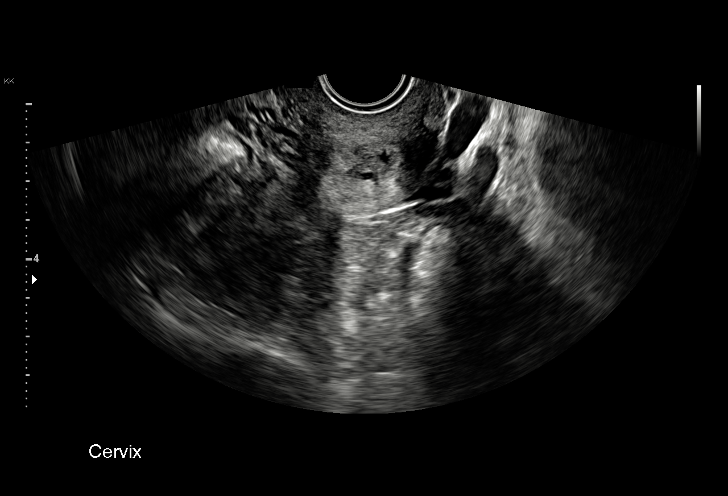
[im 67/95]
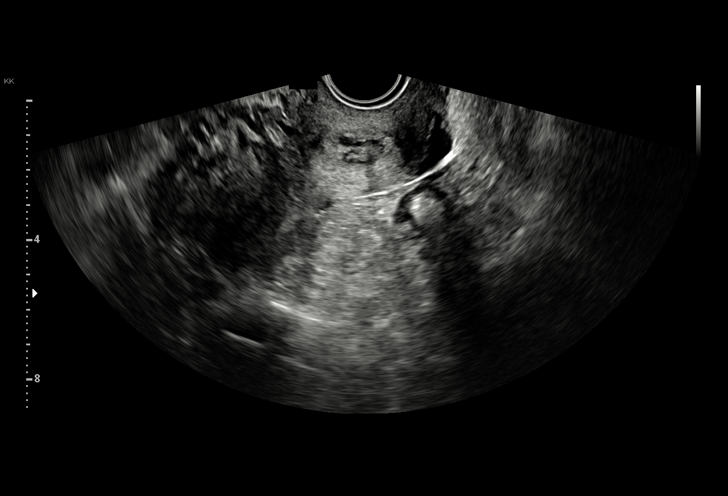
[im 74/95]
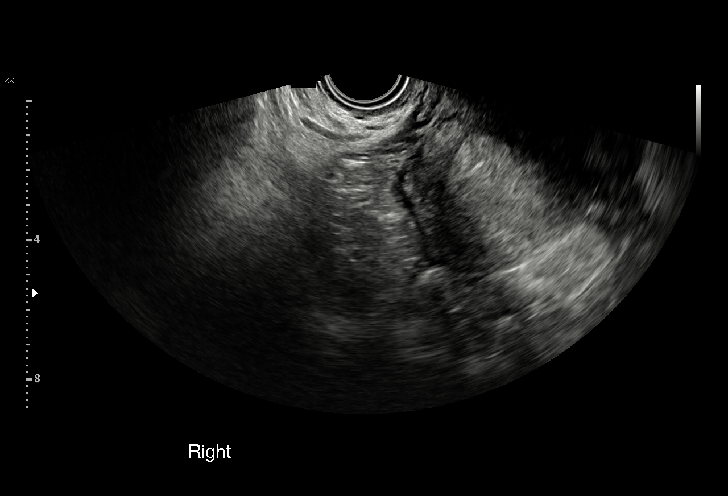
[im 81/95]
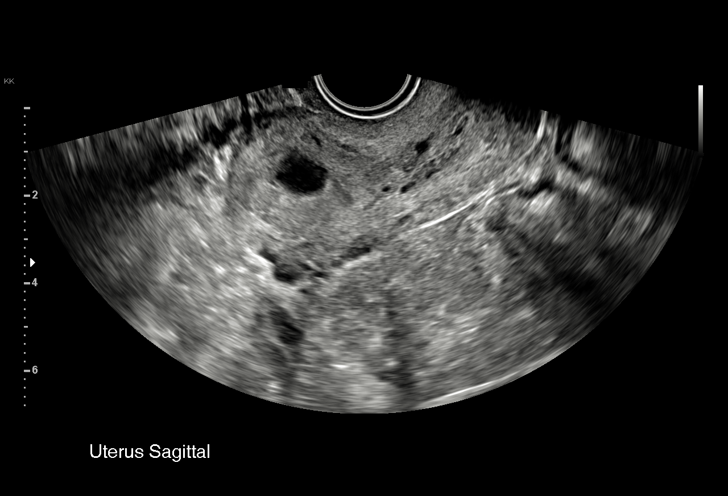
[im 88/95]
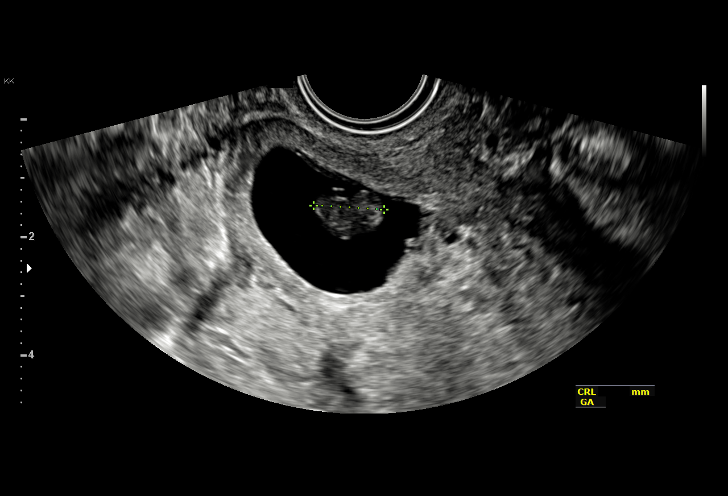
[im 95/95]
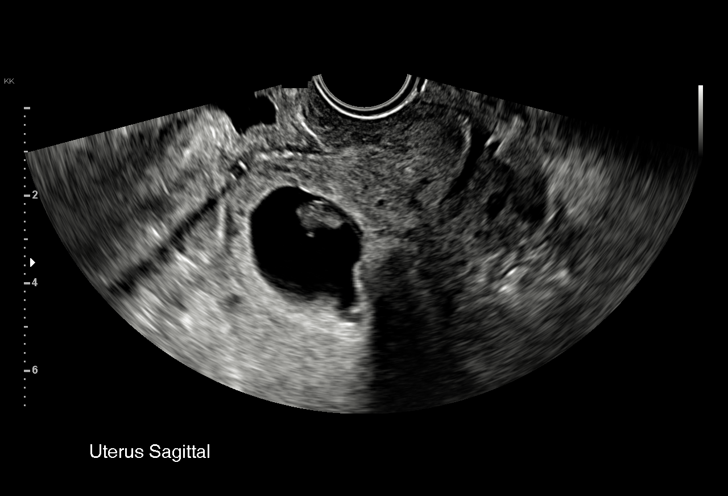

[15 of 28 positions shown; findings below may reference images not displayed]

FINDINGS: Intrauterine gestational sac: Single, located in anterior lower
uterine segment with bulging into myometrium at site of expected
Caesarean section scar.

Yolk sac:  Not Visualized.

Embryo:  Visualized.

Cardiac Activity: Not Visualized.

CRL:  12 mm   7 w   3 d

Subchorionic hemorrhage:  None visualized.

Maternal uterus/adnexae: Normal appearance of both ovaries. No
adnexal mass or abnormal free fluid identified.
IMPRESSION: C-section scar ectopic pregnancy, which is nonviable.

No evidence of adnexal mass or hemoperitoneum.

Critical Value/emergent results were called by telephone at the time
of interpretation on 06/15/2018 at [DATE] to Dr. JAGDISH PAREDEZ , who
verbally acknowledged these results.

## 2021-01-03 ENCOUNTER — Other Ambulatory Visit: Payer: No Typology Code available for payment source

## 2021-01-03 ENCOUNTER — Other Ambulatory Visit: Payer: Self-pay | Admitting: Obstetrics and Gynecology

## 2021-01-03 ENCOUNTER — Other Ambulatory Visit: Payer: Self-pay

## 2021-01-03 ENCOUNTER — Ambulatory Visit
Admission: RE | Admit: 2021-01-03 | Discharge: 2021-01-03 | Disposition: A | Discharge status: Home / Self Care | Payer: No Typology Code available for payment source | Source: Ambulatory Visit | Attending: Obstetrics and Gynecology | Admitting: Obstetrics and Gynecology

## 2021-01-03 DIAGNOSIS — R928 Other abnormal and inconclusive findings on diagnostic imaging of breast: Secondary | ICD-10-CM

## 2021-04-17 ENCOUNTER — Other Ambulatory Visit: Payer: Self-pay

## 2021-04-17 DIAGNOSIS — R928 Other abnormal and inconclusive findings on diagnostic imaging of breast: Secondary | ICD-10-CM

## 2021-06-11 ENCOUNTER — Ambulatory Visit: Payer: Self-pay

## 2021-06-11 ENCOUNTER — Other Ambulatory Visit: Payer: Self-pay

## 2021-06-20 ENCOUNTER — Other Ambulatory Visit: Payer: Self-pay

## 2021-06-20 ENCOUNTER — Ambulatory Visit: Payer: Self-pay

## 2021-06-25 ENCOUNTER — Encounter (INDEPENDENT_AMBULATORY_CARE_PROVIDER_SITE_OTHER): Payer: Self-pay

## 2021-06-25 ENCOUNTER — Ambulatory Visit: Payer: Self-pay | Admitting: *Deleted

## 2021-06-25 ENCOUNTER — Ambulatory Visit: Admission: RE | Admit: 2021-06-25 | Payer: Self-pay | Source: Ambulatory Visit

## 2021-06-25 ENCOUNTER — Other Ambulatory Visit: Payer: Self-pay

## 2021-06-25 ENCOUNTER — Ambulatory Visit
Admission: RE | Admit: 2021-06-25 | Discharge: 2021-06-25 | Disposition: A | Discharge status: Home / Self Care | Payer: No Typology Code available for payment source | Source: Ambulatory Visit | Attending: Obstetrics and Gynecology | Admitting: Obstetrics and Gynecology

## 2021-06-25 VITALS — BP 136/80 | Wt 131.2 lb

## 2021-06-25 DIAGNOSIS — R928 Other abnormal and inconclusive findings on diagnostic imaging of breast: Secondary | ICD-10-CM

## 2021-06-25 DIAGNOSIS — Z1239 Encounter for other screening for malignant neoplasm of breast: Secondary | ICD-10-CM

## 2021-06-25 NOTE — Patient Instructions (Signed)
Explained breast self awareness with Erin [name]. Patient did not need a Pap smear today due to last Pap smear and HPV typing was 03/07/2020. Let her know BCCCP will cover Pap smears and HPV Typing every 5 years unless has a history of abnormal Pap smears. Referred patient to the [facility] for a diagnostic mammogram per recommendation. Appointment scheduled Tuesday, June 25, 2021 at 1010. Patient aware of appointment and will be there. Erin [name] verbalized understanding.  Moxie Kalil, Kathaleen Maser, RN 8:25 AM

## 2021-06-25 NOTE — Progress Notes (Signed)
[name] is a 45 y.o. female who presents to [facility] clinic today with no complaints. Patient had a screening mammogram 06/01/2020 that additional imaging of the right breast was recommended for follow-up. Patient had a follow-up right breast diagnostic mammogram completed 07/03/2020 that 35-month right breast ultrasound was recommended for follow-up. Six month right breast ultrasound was completed 01/03/2021 that recommended a bilateral diagnostic mammogram and right breast ultrasound in late October/early November 2022.    Pap Smear: Pap smear not completed today. Last Pap smear was 03/07/2020 at the [facility] Department clinic and was normal with negative HPV. Per patient has no history of an abnormal Pap smear. Last Pap smear result is available in Epic.    Physical exam: Breasts Left breast slightly larger than right breast. No skin abnormalities bilateral breasts. No nipple retraction bilateral breasts. No nipple discharge bilateral breasts. No lymphadenopathy. No lumps palpated bilateral breasts. No complaints of pain or tenderness on exam.     MS DIGITAL SCREENING TOMO BILATERAL  Result Date: 06/01/2020 CLINICAL DATA:  Screening. EXAM: DIGITAL SCREENING BILATERAL MAMMOGRAM WITH TOMO AND CAD COMPARISON:  None. ACR Breast Density Category c: The breast tissue is heterogeneously dense, which may obscure small masses. FINDINGS: In the right breast, a possible mass warrants further evaluation. In the left breast, no findings suspicious for malignancy. Images were processed with CAD. IMPRESSION: Further evaluation is suggested for a possible mass in the right breast. RECOMMENDATION: Diagnostic mammogram and possibly ultrasound of the right breast. (Code:[id number]-32M) The patient will be contacted regarding the findings, and additional imaging will be scheduled. BI-RADS CATEGORY  0: Incomplete. Need additional imaging evaluation and/or prior mammograms for comparison. Electronically  Signed   By: Baird Lyons M.D.   On: 06/01/2020 12:42   MS DIGITAL DIAG TOMO UNI RIGHT  Result Date: 07/03/2020 CLINICAL DATA:  Recall from baseline screening mammography with tomosynthesis, possible mass or focal asymmetry involving the INNER RIGHT breast at MIDDLE depth. EXAM: DIGITAL DIAGNOSTIC RIGHT MAMMOGRAM WITH TOMO ULTRASOUND RIGHT BREAST COMPARISON:  Baseline mammography 05/31/2020.  No prior ultrasound. ACR Breast Density Category c: The breast tissue is heterogeneously dense, which may obscure small masses. FINDINGS: Tomosynthesis and synthesized spot-compression CC and MLO views of the area of concern in the RIGHT breast were obtained. Spot compression images confirm a partially obscured isodense mass whose visible margins are circumscribed, measuring approximately 8-9 mm, without associated architectural distortion or suspicious calcifications. Targeted RIGHT breast ultrasound is performed, showing an oval circumscribed parallel nearly isoechoic mass at the 2 o'clock position approximately 5 cm from the nipple at MIDDLE depth, measuring approximately 1.1 x 0.6 x 1.0 cm, demonstrating no posterior characteristics and no internal power Doppler flow, corresponding to the screening mammographic finding. IMPRESSION: Likely benign 1.1 cm mass involving the UPPER INNER QUADRANT of the RIGHT breast at the 2 o'clock position approximately 5 cm from the nipple, likely a fibroadenoma. RECOMMENDATION: We discussed management options for the likely benign fibroadenoma including excision, ultrasound-guided core biopsy, and short term interval follow-up. Follow-up ultrasound is recommended at 6, 12 and 24 months to assess stability. The patient agrees with this plan. I have discussed the findings and recommendations with the patient. Communication with the patient was achieved with the assistance of a certified interpreter. If applicable, a reminder letter will be sent to the patient regarding the next  appointment. BI-RADS CATEGORY  3: Probably benign. Electronically Signed   By: Hulan Saas M.D.   On: 07/03/2020 11:24  Pelvic/Bimanual Pap is not indicated today per BCCCP guidelines.   Smoking History: Patient has never smoked.   Patient Navigation: Patient education provided. Access to services provided for patient through [place] program. Spanish interpreter Natale Lay from [facility] provided.    Breast and Cervical Cancer Risk Assessment: Patient does not have family history of breast cancer, known genetic mutations, or radiation treatment to the chest before age 68. Patient does not have history of cervical dysplasia, immunocompromised, or DES exposure in-utero.  Risk Assessment   No risk assessment data for the current encounter  Risk Scores       04/19/2020   Last edited by: Narda Rutherford, LPN   5-year risk: 0.4 %   Lifetime risk: 7.1 %            A: BCCCP exam without pap smear No complaints.  P: Referred patient to the [facility] of [facility] for a diagnostic mammogram per recommendation. Appointment scheduled Tuesday, June 25, 2021 at 1010.  Priscille Heidelberg, RN 06/25/2021 8:25 AM

## 2022-04-15 ENCOUNTER — Other Ambulatory Visit: Payer: Self-pay

## 2022-04-15 DIAGNOSIS — Z87898 Personal history of other specified conditions: Secondary | ICD-10-CM

## 2022-07-03 ENCOUNTER — Ambulatory Visit
Admission: RE | Admit: 2022-07-03 | Discharge: 2022-07-03 | Disposition: A | Discharge status: Home / Self Care | Payer: No Typology Code available for payment source | Source: Ambulatory Visit | Attending: Obstetrics and Gynecology | Admitting: Obstetrics and Gynecology

## 2022-07-03 ENCOUNTER — Ambulatory Visit: Payer: Self-pay | Admitting: Hematology and Oncology

## 2022-07-03 ENCOUNTER — Other Ambulatory Visit: Payer: Self-pay | Admitting: Obstetrics and Gynecology

## 2022-07-03 VITALS — BP 130/81 | Wt 129.0 lb

## 2022-07-03 DIAGNOSIS — R928 Other abnormal and inconclusive findings on diagnostic imaging of breast: Secondary | ICD-10-CM

## 2022-07-03 DIAGNOSIS — Z87898 Personal history of other specified conditions: Secondary | ICD-10-CM

## 2022-07-03 NOTE — Progress Notes (Addendum)
[name] is a 46 y.o. female who presents to [facility] clinic today with no complaints .    Pap Smear: Pap not smear completed today. Last Pap smear was 2021 and was normal. Per patient has no history of an abnormal Pap smear. Last Pap smear result is available in Epic.   Physical exam: Breasts Breasts symmetrical. No skin abnormalities bilateral breasts. No nipple retraction bilateral breasts. No nipple discharge bilateral breasts. No lymphadenopathy. No lumps palpated bilateral breasts.     MS DIGITAL DIAG TOMO BILAT  Result Date: 07/03/2022 CLINICAL DATA:  Follow-up right breast probably benign mass. EXAM: DIGITAL DIAGNOSTIC BILATERAL MAMMOGRAM WITH TOMOSYNTHESIS; ULTRASOUND RIGHT BREAST LIMITED TECHNIQUE: Bilateral digital diagnostic mammography and breast tomosynthesis was performed.; Targeted ultrasound examination of the right breast was performed COMPARISON:  Previous exam(s). ACR Breast Density Category c: The breast tissue is heterogeneously dense, which may obscure small masses. FINDINGS: The previously demonstrated oval, circumscribed mass in the medial right breast is slightly smaller than when initially seen on 05/31/2020. No interval findings in either breast suspicious for malignancy. Targeted ultrasound is performed, showing a 9 x 8 x 4 mm oval, horizontally oriented, circumscribed, mildly hypoechoic mass in the 2 o'clock position of the right breast, 5 cm from the nipple. This measured 10 x 10 x 5 mm on 01/03/2021 and 11 x 10 x 6 mm 07/03/2020. IMPRESSION: 1. Interval decrease in size of the previously demonstrated probably benign mass in the 2 o'clock position of the right breast. The decrease in size over a 2 year period of time is compatible with a benign mass and does not need further follow-up. 2. No evidence of malignancy in either breast. RECOMMENDATION: Bilateral screening mammogram in 1 year. I have discussed the findings and recommendations with the patient. If  applicable, a reminder letter will be sent to the patient regarding the next appointment. BI-RADS CATEGORY  2: Benign. Electronically Signed   By: Beckie Salts M.D.   On: 07/03/2022 12:47  MS DIGITAL DIAG TOMO BILAT  Result Date: 06/25/2021 CLINICAL DATA:  Follow-up for probably benign mass within the RIGHT breast. This probably benign finding was originally identified on baseline screening mammogram dated 05/31/2020. EXAM: DIGITAL DIAGNOSTIC BILATERAL MAMMOGRAM WITH TOMOSYNTHESIS AND CAD TECHNIQUE: Bilateral digital diagnostic mammography and breast tomosynthesis was performed. The images were evaluated with computer-aided detection. COMPARISON:  Previous exams including diagnostic mammogram and ultrasound dated 07/03/2020. ACR Breast Density Category c: The breast tissue is heterogeneously dense, which may obscure small masses. FINDINGS: The oval circumscribed mass within the inner RIGHT breast is stable in size. There are no new dominant masses, suspicious calcifications or secondary signs of malignancy within either breast. IMPRESSION: 1. 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 Recommend additional follow-up diagnostic mammogram, and possible ultrasound, in 12 months to ensure 2 year stability. 2. No evidence of malignancy within the LEFT breast. RECOMMENDATION: Bilateral diagnostic mammogram, and possible RIGHT breast ultrasound, in 12 months. I have discussed the findings and recommendations with the patient with the aid of an interpreter. If applicable, a reminder letter will be sent to the patient regarding the next appointment. BI-RADS CATEGORY  3: Probably benign. Electronically Signed   By: Bary Richard M.D.   On: 06/25/2021 10:49  MS DIGITAL DIAG TOMO UNI RIGHT  Result Date: 07/03/2020 CLINICAL DATA:  Recall from baseline screening mammography with tomosynthesis, possible mass or focal asymmetry  involving the INNER RIGHT  breast at MIDDLE depth. EXAM: DIGITAL DIAGNOSTIC RIGHT MAMMOGRAM WITH TOMO ULTRASOUND RIGHT BREAST COMPARISON:  Baseline mammography 05/31/2020.  No prior ultrasound. ACR Breast Density Category c: The breast tissue is heterogeneously dense, which may obscure small masses. FINDINGS: Tomosynthesis and synthesized spot-compression CC and MLO views of the area of concern in the RIGHT breast were obtained. Spot compression images confirm a partially obscured isodense mass whose visible margins are circumscribed, measuring approximately 8-9 mm, without associated architectural distortion or suspicious calcifications. Targeted RIGHT breast ultrasound is performed, showing an oval circumscribed parallel nearly isoechoic mass at the 2 o'clock position approximately 5 cm from the nipple at MIDDLE depth, measuring approximately 1.1 x 0.6 x 1.0 cm, demonstrating no posterior characteristics and no internal power Doppler flow, corresponding to the screening mammographic finding. IMPRESSION: Likely benign 1.1 cm mass involving the UPPER INNER QUADRANT of the RIGHT breast at the 2 o'clock position approximately 5 cm from the nipple, likely a fibroadenoma. RECOMMENDATION: We discussed management options for the likely benign fibroadenoma including excision, ultrasound-guided core biopsy, and short term interval follow-up. Follow-up ultrasound is recommended at 6, 12 and 24 months to assess stability. The patient agrees with this plan. I have discussed the findings and recommendations with the patient. Communication with the patient was achieved with the assistance of a certified interpreter. If applicable, a reminder letter will be sent to the patient regarding the next appointment. BI-RADS CATEGORY  3: Probably benign. Electronically Signed   By: Hulan Saas M.D.   On: 07/03/2020 11:24   MS DIGITAL SCREENING TOMO BILATERAL  Result Date: 06/01/2020 CLINICAL DATA:  Screening. EXAM: DIGITAL  SCREENING BILATERAL MAMMOGRAM WITH TOMO AND CAD COMPARISON:  None. ACR Breast Density Category c: The breast tissue is heterogeneously dense, which may obscure small masses. FINDINGS: In the right breast, a possible mass warrants further evaluation. In the left breast, no findings suspicious for malignancy. Images were processed with CAD. IMPRESSION: Further evaluation is suggested for a possible mass in the right breast. RECOMMENDATION: Diagnostic mammogram and possibly ultrasound of the right breast. (Code:[id number]) The patient will be contacted regarding the findings, and additional imaging will be scheduled. BI-RADS CATEGORY  0: Incomplete. Need additional imaging evaluation and/or prior mammograms for comparison. Electronically Signed   By: Baird Lyons M.D.   On: 06/01/2020 12:42      Pelvic/Bimanual Pap is not indicated today    Smoking History: Patient has never smoked and was not referred to quit line.    Patient Navigation: Patient education provided. Access to services provided for patient through BCCCP program. Natale Lay interpreter provided. No transportation provided   Colorectal Cancer Screening: Per patient has never had colonoscopy completed No complaints today.    Breast and Cervical Cancer Risk Assessment: Patient does not have family history of breast cancer, known genetic mutations, or radiation treatment to the chest before age 44. Patient does not have history of cervical dysplasia, immunocompromised, or DES exposure in-utero.  Risk Assessment   No risk assessment data for the current encounter  Risk Scores       06/25/2021   Last edited by: Narda Rutherford, LPN   5-year risk: 0.5 %   Lifetime risk: 7 %            A: BCCCP exam without pap smear No complaints with benign exam. Continued follow up of stable probably benign mass within the right breast.  P: Referred patient to the [facility] for a diagnostic mammogram. Appointment  scheduled 07/03/22.  Pascal Lux, NP 07/03/2022 8:31 AM

## 2022-07-03 NOTE — Patient Instructions (Signed)
Taught Erin [name] about breast self awareness. Patient did not need a Pap smear today due to last Pap smear was in 2021 per patient. Let her know BCCCP will cover Pap smears every 5 years unless has a history of abnormal Pap smears. Referred patient to the [facility] for diagnostic mammogram. Appointment scheduled for 07/03/22. Patient aware of appointment and will be there. Let patient know will follow up with her within the next couple weeks with results. Erin [name] verbalized understanding.  Pascal Lux, NP 8:48 AM

## 2023-05-12 ENCOUNTER — Telehealth: Payer: Self-pay

## 2023-05-12 NOTE — Telephone Encounter (Signed)
Telephoned patient at mobile number using interpreter, Erin [name]. Left voice message with BCCCP (scholarship) contact information.

## 2023-05-18 ENCOUNTER — Other Ambulatory Visit: Payer: Self-pay | Admitting: Obstetrics & Gynecology

## 2023-05-18 DIAGNOSIS — Z1231 Encounter for screening mammogram for malignant neoplasm of breast: Secondary | ICD-10-CM

## 2023-07-21 ENCOUNTER — Ambulatory Visit
Admission: RE | Admit: 2023-07-21 | Discharge: 2023-07-21 | Disposition: A | Discharge status: Home / Self Care | Payer: No Typology Code available for payment source | Source: Ambulatory Visit | Attending: Obstetrics & Gynecology | Admitting: Obstetrics & Gynecology

## 2023-07-21 DIAGNOSIS — Z1231 Encounter for screening mammogram for malignant neoplasm of breast: Secondary | ICD-10-CM
